# Patient Record
Sex: Female | Born: 1990 | Race: Asian | Hispanic: No | Marital: Married | Smoking: Never smoker
Health system: Southern US, Community
[De-identification: ages and names within clinical notes are randomized; demographics above are authoritative.]

## PROBLEM LIST (undated history)

## (undated) ENCOUNTER — Inpatient Hospital Stay (HOSPITAL_COMMUNITY): Payer: Self-pay

## (undated) DIAGNOSIS — J45909 Unspecified asthma, uncomplicated: Secondary | ICD-10-CM

## (undated) DIAGNOSIS — D219 Benign neoplasm of connective and other soft tissue, unspecified: Secondary | ICD-10-CM

## (undated) DIAGNOSIS — Z789 Other specified health status: Secondary | ICD-10-CM

## (undated) HISTORY — PX: NO PAST SURGERIES: SHX2092

## (undated) HISTORY — DX: Benign neoplasm of connective and other soft tissue, unspecified: D21.9

## (undated) HISTORY — DX: Unspecified asthma, uncomplicated: J45.909

---
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
## 2012-05-12 IMAGING — US US ART/VEN ABD/PELV/SCROTUM DOPPLER LTD
1 series · 14 of 25 positions shown · non-contrast
Comparison: None

CLINICAL DATA: Pelvic pain, left greater than right.

TRANSABDOMINAL ULTRASOUND OF PELVIS
DOPPLER ULTRASOUND OF OVARIES
TECHNIQUE: Transabdominal ultrasound examination of the pelvis was
performed including evaluation of the uterus, ovaries, adnexal
regions, and pelvic cul-de-sac. Color and duplex Doppler ultrasound
was utilized to evaluate blood flow to the ovaries.

[Series 1: us art/ven abd/pelv/scrotum doppler ltd · 0.28mm/px · 14 of 36 slices shown]
[im 1/36]
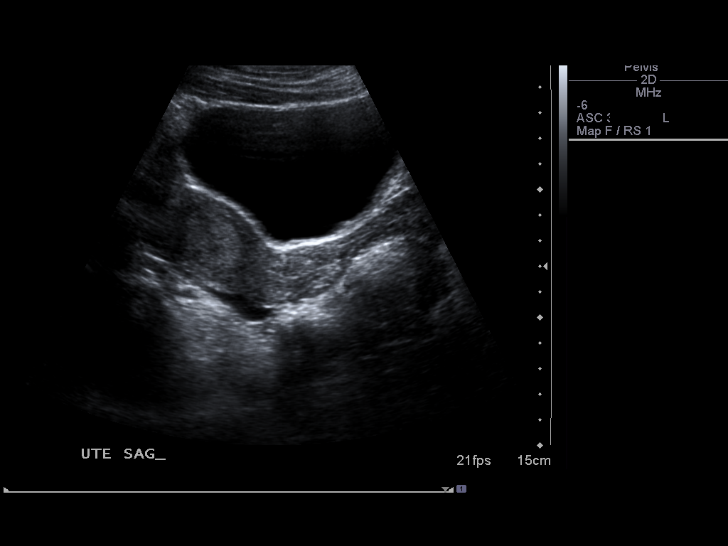
[im 3/36]
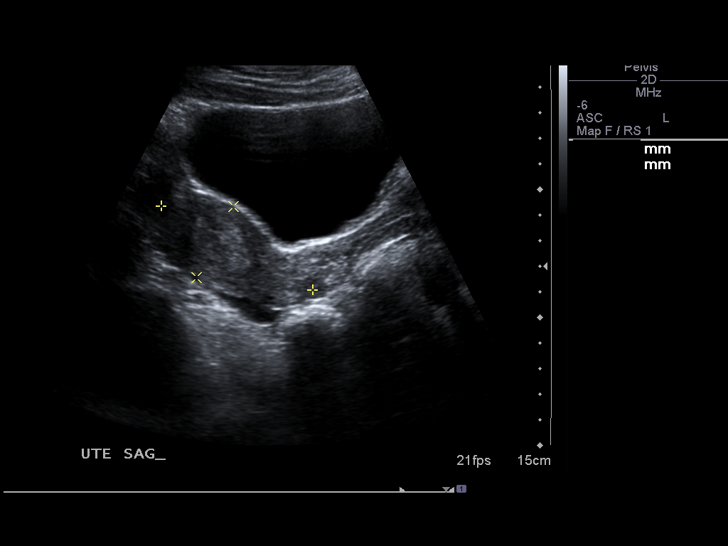
[im 6/36]
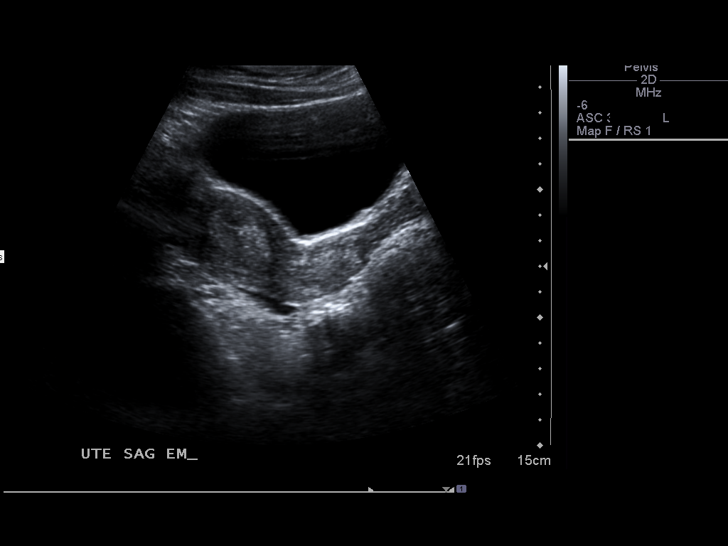
[im 9/36]
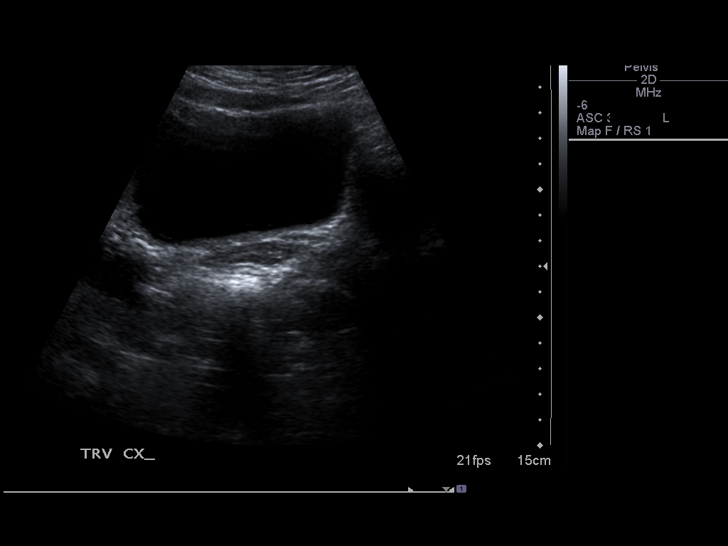
[im 12/36]
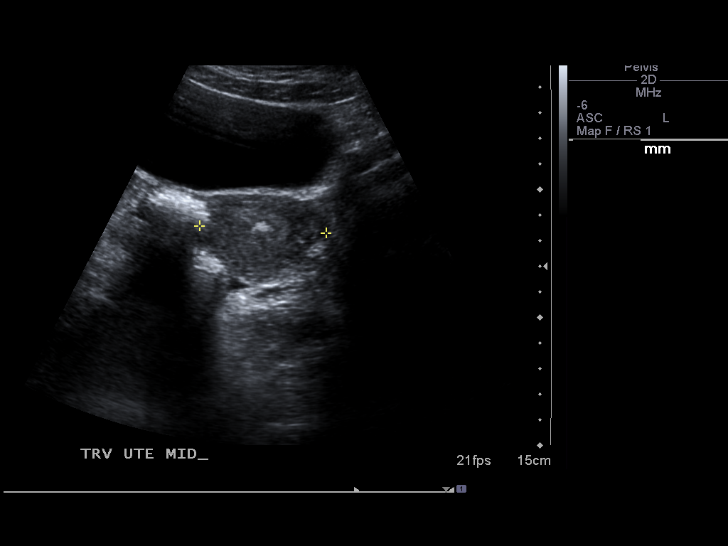
[im 14/36]
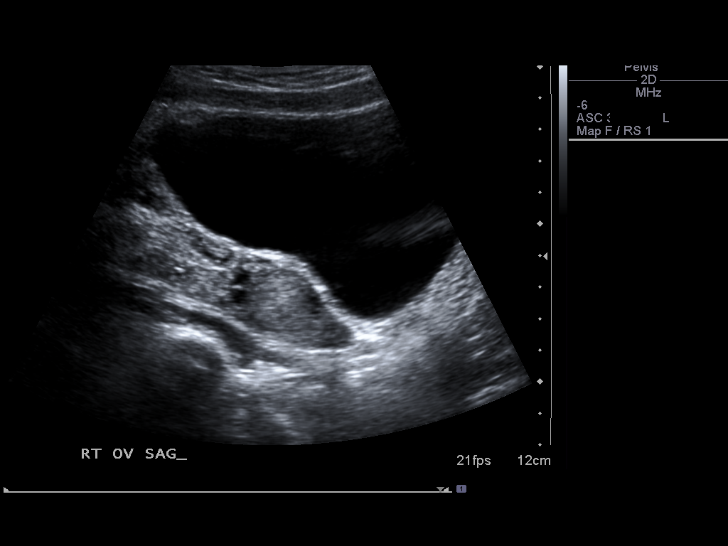
[im 17/36]
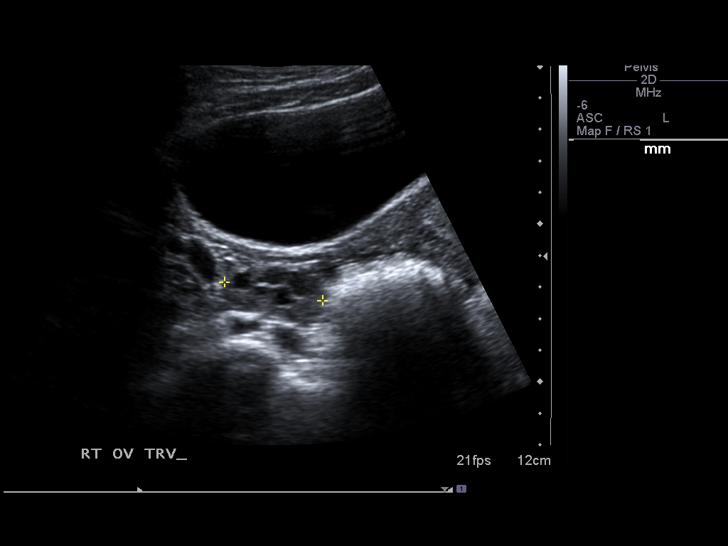
[im 19/36]
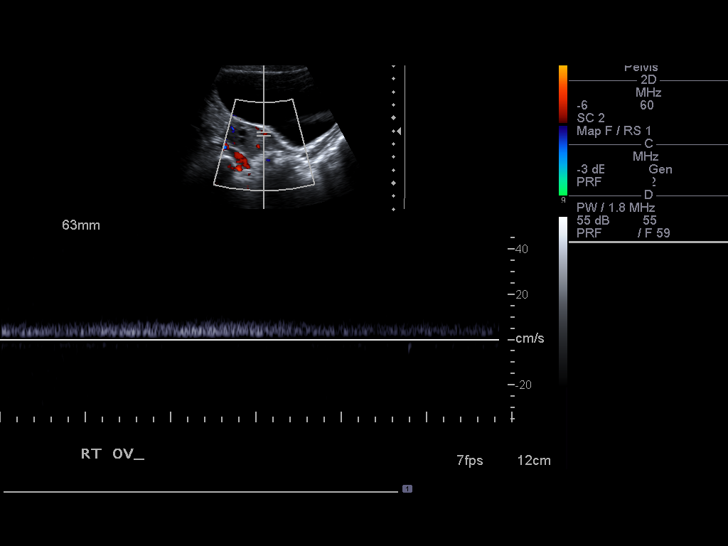
[im 22/36]
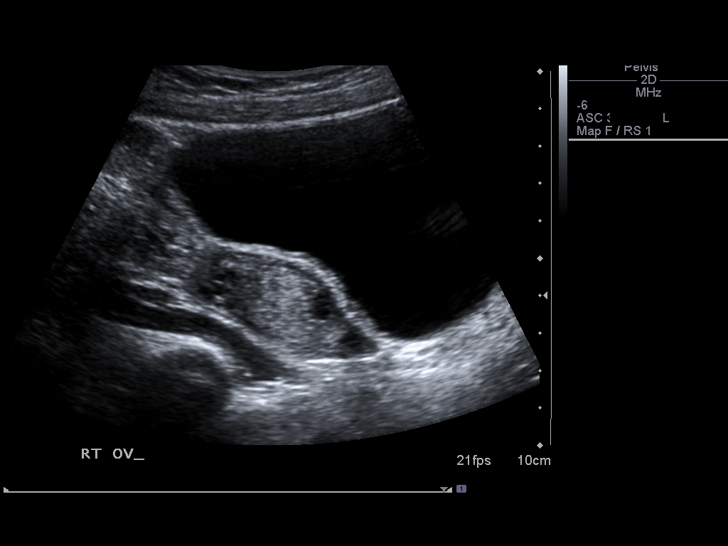
[im 24/36]
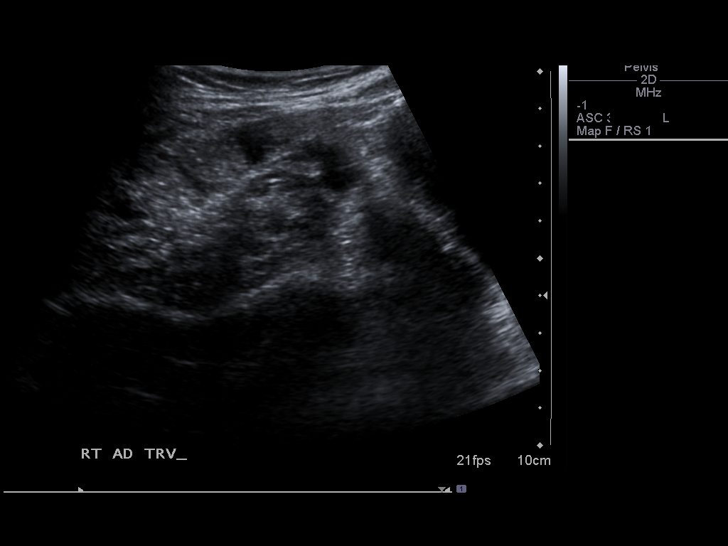
[im 27/36]
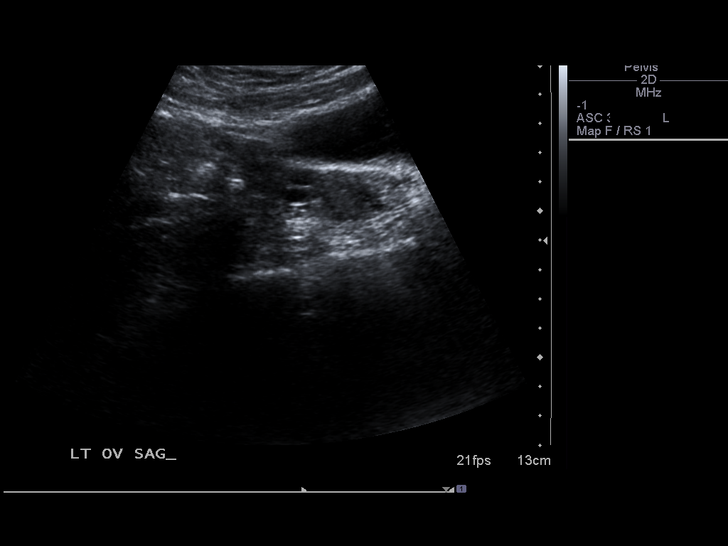
[im 30/36]
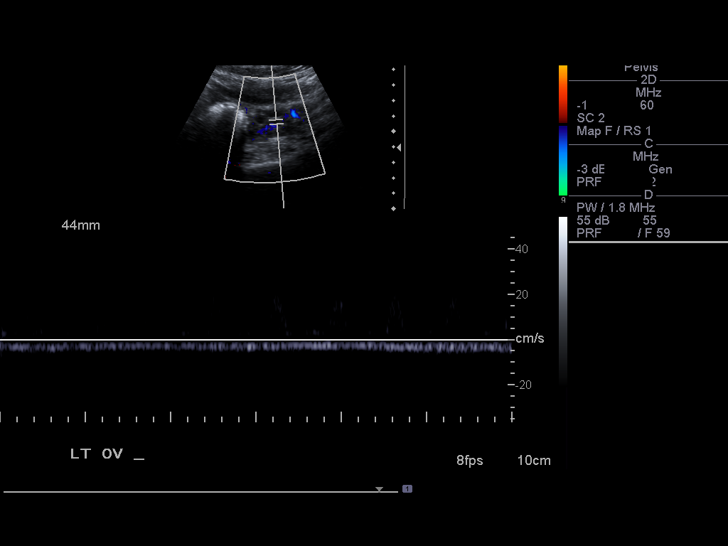
[im 33/36]
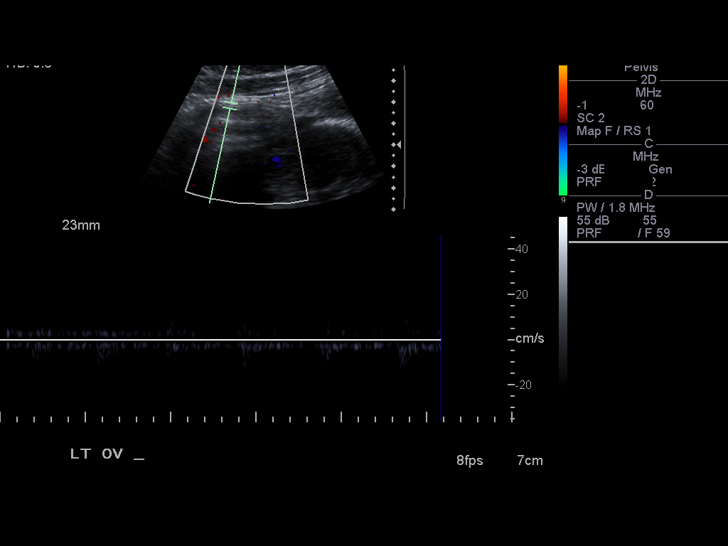
[im 36/36]
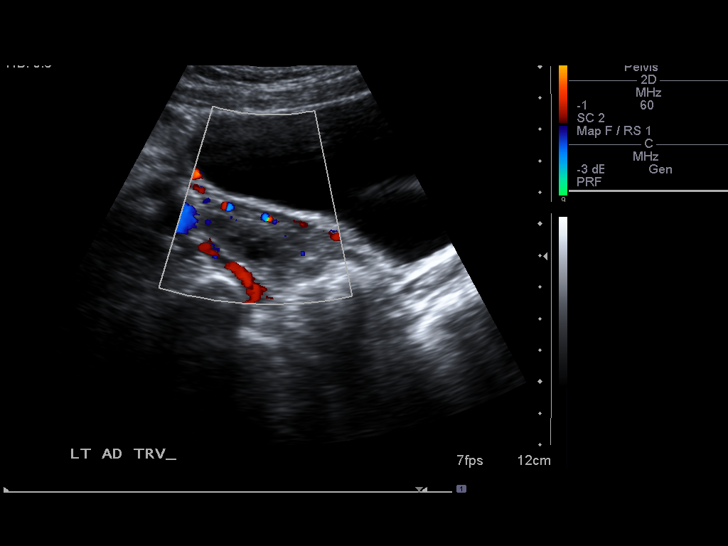

[14 of 25 positions shown; findings below may reference images not displayed]

FINDINGS: Uterus 6.9 x 3.1 x 5.0 cm.  Normal echotexture.  No focal
abnormality.

Endometrium normal, 2 mm.

Right Ovary 4.4 x 2.3 x 3.2 cm. Normal size and echotexture.  No
adnexal masses.  Normal arterial and venous blood flow.

Left Ovary 4.5 x 1.9 x 3.8 cm. Normal size and echotexture.  No
adnexal masses.  Normal arterial and venous blood flow.

Other Findings:  No free fluid.
IMPRESSION: Unremarkable study.

## 2015-04-24 ENCOUNTER — Ambulatory Visit (INDEPENDENT_AMBULATORY_CARE_PROVIDER_SITE_OTHER): Payer: BLUE CROSS/BLUE SHIELD | Admitting: Family Medicine

## 2015-04-24 VITALS — BP 122/72 | HR 86 | Temp 98.3°F | Resp 17 | Ht 61.5 in | Wt 135.0 lb

## 2015-04-24 DIAGNOSIS — R062 Wheezing: Secondary | ICD-10-CM

## 2015-04-24 DIAGNOSIS — J4521 Mild intermittent asthma with (acute) exacerbation: Secondary | ICD-10-CM | POA: Diagnosis not present

## 2015-04-24 DIAGNOSIS — R059 Cough, unspecified: Secondary | ICD-10-CM

## 2015-04-24 DIAGNOSIS — R05 Cough: Secondary | ICD-10-CM

## 2015-04-24 MED ORDER — ALBUTEROL SULFATE HFA 108 (90 BASE) MCG/ACT IN AERS: 2.0000 | INHALATION_SPRAY | RESPIRATORY_TRACT | Status: DC | PRN

## 2015-04-24 MED ORDER — PREDNISONE 20 MG PO TABS: ORAL_TABLET | ORAL | Status: DC

## 2015-04-24 MED ORDER — ALBUTEROL SULFATE (2.5 MG/3ML) 0.083% IN NEBU
2.5000 mg | INHALATION_SOLUTION | Freq: Once | RESPIRATORY_TRACT | Status: AC
  Administered 2015-04-24: 2.5 mg via RESPIRATORY_TRACT

## 2015-04-24 MED ORDER — IPRATROPIUM BROMIDE 0.02 % IN SOLN
0.5000 mg | Freq: Once | RESPIRATORY_TRACT | Status: AC
  Administered 2015-04-24: 0.5 mg via RESPIRATORY_TRACT

## 2015-04-24 NOTE — Progress Notes (Signed)
Subjective:    Patient ID: Lindsey Harvey, female    DOB: 12-05-1990, 25 y.o.   MRN: [id number]  HPI This is a pleasant 25 yo female who presents today with 2 week history of dry cough. She has a history of asthma. She does not take any medicine. Runny nose resolved, no sore throat or ear pain. Hears wheezes at night and in the morning. Used an inhaler last year for her asthma and stopped it when she felt better. Rarely has trouble with breathing, usually 1-2 x per year with allergy or cold symptoms.  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 Wears a mask.   Past Medical History  Diagnosis Date  . Asthma    History reviewed. No pertinent past surgical history. History reviewed. No pertinent family history. Social History  Substance Use Topics  . Smoking status: Never Smoker   . Smokeless tobacco: None  . Alcohol Use: No    Review of Systems  Constitutional: Positive for fatigue. Negative for fever and chills.  HENT: Negative for congestion, ear pain, postnasal drip, rhinorrhea and sore throat.   Respiratory: Positive for cough, chest tightness and wheezing. Negative for shortness of breath.   Neurological: Negative for headaches.       Objective:   Physical Exam  Constitutional: She is oriented to person, place, and time. She appears well-developed and well-nourished. No distress.  HENT:  Head: Normocephalic and atraumatic.  Right Ear: Tympanic membrane, external ear and ear canal normal.  Left Ear: Tympanic membrane, external ear and ear canal normal.  Nose: Nose normal.  Mouth/Throat: Oropharynx is clear and moist. No oropharyngeal exudate.  Eyes: Conjunctivae are normal.  Neck: Normal range of motion. Neck supple.  Cardiovascular: Normal rate, regular rhythm and normal heart sounds.   Pulmonary/Chest: Effort normal. She has wheezes (few fine expiratory wheezes, cleared after nebulizer).  Musculoskeletal: Normal range of motion.  Lymphadenopathy:    She has no  cervical adenopathy.  Neurological: She is alert and oriented to person, place, and time.  Skin: Skin is warm and dry. She is not diaphoretic.  Psychiatric: She has a normal mood and affect. Her behavior is normal. Judgment and thought content normal.  Vitals reviewed.    BP 122/72 mmHg  Pulse 86  Temp(Src) 98.3 F (36.8 C) (Oral)  Resp 17  Ht 5' 1.5" (1.562 m)  Wt 135 lb (61.236 kg)  BMI 25.10 kg/m2  SpO2 97%  LMP 03/21/2015 Albuterol/atrovent nebulizer treatment given with subjective and objective improvement. PF- 175, after treatment 225. Expected- 472. Effort poor    Assessment & Plan:  1. Cough - albuterol (PROVENTIL) (2.5 MG/3ML) 0.083% nebulizer solution 2.5 mg; Take 3 mLs (2.5 mg total) by nebulization once. - ipratropium (ATROVENT) nebulizer solution 0.5 mg; Take 2.5 mLs (0.5 mg total) by nebulization once. - predniSONE (DELTASONE) 20 MG tablet; Take 3x3 days, 2x3 days, 1x3 days  Dispense: 18 tablet; Refill: 0 - albuterol (PROVENTIL HFA;VENTOLIN HFA) 108 (90 Base) MCG/ACT inhaler; Inhale 2 puffs into the lungs every 4 (four) hours as needed for wheezing or shortness of breath (cough, shortness of breath or wheezing.).  Dispense: 1 Inhaler; Refill: 1  2. Wheeze - albuterol (PROVENTIL) (2.5 MG/3ML) 0.083% nebulizer solution 2.5 mg; Take 3 mLs (2.5 mg total) by nebulization once. - ipratropium (ATROVENT) nebulizer solution 0.5 mg; Take 2.5 mLs (0.5 mg total) by nebulization once. - predniSONE (DELTASONE) 20 MG tablet; Take 3x3 days, 2x3 days, 1x3 days  Dispense: 18 tablet;  Refill: 0 - albuterol (PROVENTIL HFA;VENTOLIN HFA) 108 (90 Base) MCG/ACT inhaler; Inhale 2 puffs into the lungs every 4 (four) hours as needed for wheezing or shortness of breath (cough, shortness of breath or wheezing.).  Dispense: 1 Inhaler; Refill: 1  3. Asthma with acute exacerbation, mild intermittent - albuterol (PROVENTIL) (2.5 MG/3ML) 0.083% nebulizer solution 2.5 mg; Take 3 mLs (2.5 mg total) by  nebulization once. - ipratropium (ATROVENT) nebulizer solution 0.5 mg; Take 2.5 mLs (0.5 mg total) by nebulization once. - predniSONE (DELTASONE) 20 MG tablet; Take 3x3 days, 2x3 days, 1x3 days  Dispense: 18 tablet; Refill: 0 - albuterol (PROVENTIL HFA;VENTOLIN HFA) 108 (90 Base) MCG/ACT inhaler; Inhale 2 puffs into the lungs every 4 (four) hours as needed for wheezing or shortness of breath (cough, shortness of breath or wheezing.).  Dispense: 1 Inhaler; Refill: 1  - discussed asthma and need recheck if she has continued problems, may need inhaled corticosteroid - RTC precautions reviewed  Olean Reeeborah Eli Pattillo, FNP-BC  [facility]  04/24/2015 9:46 PM

## 2015-04-24 NOTE — Patient Instructions (Addendum)
If you are not feeling better in 3-5 days, please come back in to see us     IF you received an x-ray today, you will receive an [facility] from [facility] Please contact [facility] at [phone] with questions or concerns regarding your invoice.   IF you received labwork today, you will receive an invoice from [facility]. Please contact [facility] at [phone] with questions or concerns regarding your invoice.   Our billing staff will not be able to assist you with questions regarding bills from these companies.  You will be contacted with the lab results as soon as they are available. The fastest way to get your results is to activate your My Chart account. Instructions are located on the last page of this paperwork. If you have not heard from us regarding the results in 2 weeks, please contact this office.

## 2016-11-05 ENCOUNTER — Encounter: Payer: Self-pay | Admitting: Family Medicine

## 2016-11-05 ENCOUNTER — Ambulatory Visit (INDEPENDENT_AMBULATORY_CARE_PROVIDER_SITE_OTHER): Payer: Self-pay | Admitting: Family Medicine

## 2016-11-05 VITALS — BP 102/70 | HR 80 | Temp 98.0°F | Resp 16 | Ht 61.42 in | Wt 136.0 lb

## 2016-11-05 DIAGNOSIS — N912 Amenorrhea, unspecified: Secondary | ICD-10-CM

## 2016-11-05 DIAGNOSIS — Z3A01 Less than 8 weeks gestation of pregnancy: Secondary | ICD-10-CM

## 2016-11-05 LAB — POCT URINALYSIS DIP (MANUAL ENTRY)
BILIRUBIN UA: NEGATIVE
BILIRUBIN UA: NEGATIVE mg/dL
Blood, UA: NEGATIVE
GLUCOSE UA: NEGATIVE mg/dL
Leukocytes, UA: NEGATIVE
Nitrite, UA: NEGATIVE
Urobilinogen, UA: 0.2 E.U./dL
pH, UA: 5.5 (ref 5.0–8.0)

## 2016-11-05 LAB — POCT URINE PREGNANCY: Preg Test, Ur: POSITIVE — AB

## 2016-11-05 MED ORDER — PRENATAL MULTIVITAMIN PLUS DHA 27-0.8-250 MG PO CAPS: 1.0000 | ORAL_CAPSULE | Freq: Every day | ORAL | 3 refills | Status: DC

## 2016-11-05 MED ORDER — POLYETHYLENE GLYCOL 3350 17 GM/SCOOP PO POWD: 17.0000 g | Freq: Every day | ORAL | 1 refills | Status: DC | PRN

## 2016-11-05 NOTE — Progress Notes (Signed)
Subjective:  By signing my name below, I, Lindsey Harvey, attest that this documentation has been prepared under the direction and in the presence of Lindsey Sorenson, MD. Electronically Signed: Stann Harvey, Scribe. 11/05/2016 , 11:46 AM .  Patient was seen in Room 1 .   Patient ID: Lindsey Harvey, female    DOB: 1990/12/17, 26 y.o.   MRN: [id number] Chief Complaint  Patient presents with  . Possible Pregnancy    pt states she has missed her cycle by 10 days    HPI Lindsey Harvey is a 26 y.o. female who presents to Primary Care at [facility] for possible pregnancy. She states her period is about 10 days late. She believes her last period was Sept 2nd, usually lasts about 3-4 days. She denies bleeding or spotting since her last period. She denies any nausea or vomiting. She's been trying to become pregnant.   She has had some cramping in her lower abdomen that started a few days ago; sometimes during the day, and sometimes at night time. She hasn't taken any medications for her cramping. She denies taking any vitamins or supplements. This is her first pregnancy. She mentions some white vaginal discharge, but this is normal for her. She usually has some breast tenderness with her periods. She denies taking birth control. She isn't followed by OBGYN yet; would like some numbers and contacts.   She notes baseline constipation, unchanged. She's been trying to drink prune juice. She states her urination is unchanged, but doesn't drink enough water. She denies history of alcohol or smoking; denies anyone smoking at home.   She will defer flu shot. She mentions becoming really sick after receiving flu shot 5 years ago.   Past Medical History:  Diagnosis Date  . Asthma    Prior to Admission medications   Medication Sig Start Date End Date Taking? Authorizing Provider  albuterol (PROVENTIL HFA;VENTOLIN HFA) 108 (90 Base) MCG/ACT inhaler Inhale 2 puffs into the lungs every 4 (four) hours as needed for wheezing or  shortness of breath (cough, shortness of breath or wheezing.). 04/24/15   Emi Belfast, FNP  predniSONE (DELTASONE) 20 MG tablet Take 3x3 days, 2x3 days, 1x3 days 04/24/15   Emi Belfast, FNP   No Known Allergies  Review of Systems  Constitutional: Negative for chills, fatigue, fever and unexpected weight change.  Respiratory: Negative for cough.   Gastrointestinal: Positive for abdominal pain (discomfort) and constipation. Negative for diarrhea, nausea and vomiting.  Genitourinary: Positive for menstrual problem. Negative for vaginal bleeding.  Skin: Negative for rash and wound.  Neurological: Negative for dizziness, weakness and headaches.       Objective:   Physical Exam  Constitutional: She is oriented to person, place, and time. She appears well-developed and well-nourished. No distress.  HENT:  Head: Normocephalic and atraumatic.  Eyes: Pupils are equal, round, and reactive to light. EOM are normal.  Neck: Normal range of motion. Neck supple. No thyromegaly present.  Cardiovascular: Normal rate, regular rhythm, normal heart sounds and intact distal pulses.   Pulmonary/Chest: Effort normal and breath sounds normal. No respiratory distress.  Abdominal: Soft. Bowel sounds are normal. She exhibits no distension and no mass. There is no tenderness. There is no rebound, no guarding and no CVA tenderness.  Musculoskeletal: Normal range of motion. She exhibits no edema.  Lymphadenopathy:    She has no cervical adenopathy.  Neurological: She is alert and oriented to person, place, and time.  Skin: Skin is warm and dry.  She is not diaphoretic. No erythema.  Psychiatric: She has a normal mood and affect. Her behavior is normal.  Nursing note and vitals reviewed.   BP 102/70   Pulse 80   Temp 98 F (36.7 C) (Oral)   Resp 16   Ht 5' 1.42" (1.56 m)   Wt 136 lb (61.7 kg)   LMP 09/26/2016 (Approximate)   SpO2 100%   BMI 25.35 kg/m   Results for orders placed or performed  in visit on 11/05/16  POCT urine pregnancy  Result Value Ref Range   Preg Test, Ur Positive (A) Negative  POCT urinalysis dipstick  Result Value Ref Range   Color, UA yellow yellow   Clarity, UA clear clear   Glucose, UA negative negative mg/dL   Bilirubin, UA negative negative   Ketones, POC UA negative negative mg/dL   Spec Grav, UA >=1.610 (A) 1.010 - 1.025   Blood, UA negative negative   pH, UA 5.5 5.0 - 8.0   Protein Ur, POC trace (A) negative mg/dL   Urobilinogen, UA 0.2 0.2 or 1.0 E.U./dL   Nitrite, UA Negative Negative   Leukocytes, UA Negative Negative       Assessment & Plan:   1. Amenorrhea   2. Less than [redacted] weeks gestation of pregnancy    Confirmation letter given for preg medicaid. Start pnv. Establish w/ ob in ~ 1 mo. No etoh, second hand smoke, careful of foods. Rec flu shot - pt declined but will consider (didn't press as will be much more affordable after health ins is obtained).  Advised to please RTC for nurse only visit for flu shot whenever needed.  Treat constipation with prune juice and prn  Miralax.  RTC if any vag spotting/bleeding.  Orders Placed This Encounter  Procedures  . POCT urine pregnancy  . POCT urinalysis dipstick    Meds ordered this encounter  Medications  . Prenatal MV-Min-Fe Fum-FA-DHA (PRENATAL MULTIVITAMIN PLUS DHA) 27-0.8-250 MG CAPS    Sig: Take 1 capsule by mouth daily.    Dispense:  90 capsule    Refill:  3  . polyethylene glycol powder (GLYCOLAX/MIRALAX) powder    Sig: Take 17 g by mouth daily as needed for mild constipation.    Dispense:  500 g    Refill:  1    I personally performed the services described in this documentation, which was scribed in my presence. The recorded information has been reviewed and considered, and addended by me as needed.   Lindsey Harvey, M.D.  Primary Care at [facility] [address] [place], [place] [address] [phone] phone [phone] fax  11/05/16 1:24 PM

## 2016-11-05 NOTE — Patient Instructions (Addendum)
CONGRATUATIONS!!!!  Based upon your last menstrual period starting on September 26, 2016, you are [redacted] weeks pregnant and your due date is July 03, 2017  Check out these OB options: [facility] outpatient clinic [facility]     First Trimester of Pregnancy The first trimester of pregnancy is from week 1 until the end of week 13 (months 1 through 3). During this time, your baby will begin to develop inside you. At 6-8 weeks, the eyes and face are formed, and the heartbeat can be seen on ultrasound. At the end of 12 weeks, all the baby's organs are formed. Prenatal care is all the medical care you receive before the birth of your baby. Make sure you get good prenatal care and follow all of your doctor's instructions. Follow these instructions at home: Medicines  Take over-the-counter and prescription medicines only as told by your doctor. Some medicines are safe and some medicines are not safe during pregnancy.  Take a prenatal vitamin that contains at least 600 micrograms (mcg) of folic acid.  If you have trouble pooping (constipation), take medicine that will make your stool soft (stool softener) if your doctor approves. Eating and drinking  Eat regular, healthy meals.  Your doctor will tell you the amount of weight gain that is right for you.  Avoid raw meat and uncooked cheese.  If you feel sick to your stomach (nauseous) or throw up (vomit): ? Eat 4 or 5 small meals a day instead of 3 large meals. ? Try eating a few soda crackers. ? Drink liquids between meals instead of during meals.  To prevent constipation: ? Eat foods that are high in fiber, like fresh fruits and vegetables, whole grains, and beans. ? Drink enough fluids to keep your pee (urine) clear or pale yellow. Activity  Exercise only as told by your doctor. Stop exercising if you have cramps or pain in your lower belly (abdomen) or low back.  Do not exercise  if it is too hot, too humid, or if you are in a place of great height (high altitude).  Try to avoid standing for long periods of time. Move your legs often if you must stand in one place for a long time.  Avoid heavy lifting.  Wear low-heeled shoes. Sit and stand up straight.  You can have sex unless your doctor tells you not to. Relieving pain and discomfort  Wear a good support bra if your breasts are sore.  Take warm water baths (sitz baths) to soothe pain or discomfort caused by hemorrhoids. Use hemorrhoid cream if your doctor says it is okay.  Rest with your legs raised if you have leg cramps or low back pain.  If you have puffy, bulging veins (varicose veins) in your legs: ? Wear support hose or compression stockings as told by your doctor. ? Raise (elevate) your feet for 15 minutes, 3-4 times a day. ? Limit salt in your food. Prenatal care  Schedule your prenatal visits by the twelfth week of pregnancy.  Write down your questions. Take them to your prenatal visits.  Keep all your prenatal visits as told by your doctor. This is important. Safety  Wear your seat belt at all times when driving.  Make a list of emergency phone numbers. The list should include numbers for family, friends, the hospital, and police and fire departments. General instructions  Ask your doctor for a referral to a local prenatal class. Begin classes no  later than at the start of month 6 of your pregnancy.  Ask for help if you need counseling or if you need help with nutrition. Your doctor can give you advice or tell you where to go for help.  Do not use hot tubs, steam rooms, or saunas.  Do not douche or use tampons or scented sanitary pads.  Do not cross your legs for long periods of time.  Avoid all herbs and alcohol. Avoid drugs that are not approved by your doctor.  Do not use any tobacco products, including cigarettes, chewing tobacco, and electronic cigarettes. If you need help  quitting, ask your doctor. You may get counseling or other support to help you quit.  Avoid cat litter boxes and soil used by cats. These carry germs that can cause birth defects in the baby and can cause a loss of your baby (miscarriage) or stillbirth.  Visit your dentist. At home, brush your teeth with a soft toothbrush. Be gentle when you floss. Contact a doctor if:  You are dizzy.  You have mild cramps or pressure in your lower belly.  You have a nagging pain in your belly area.  You continue to feel sick to your stomach, you throw up, or you have watery poop (diarrhea).  You have a bad smelling fluid coming from your vagina.  You have pain when you pee (urinate).  You have increased puffiness (swelling) in your face, hands, legs, or ankles. Get help right away if:  You have a fever.  You are leaking fluid from your vagina.  You have spotting or bleeding from your vagina.  You have very bad belly cramping or pain.  You gain or lose weight rapidly.  You throw up blood. It may look like coffee grounds.  You are around people who have [place] measles, fifth disease, or chickenpox.  You have a very bad headache.  You have shortness of breath.  You have any kind of trauma, such as from a fall or a car accident. Summary  The first trimester of pregnancy is from week 1 until the end of week 13 (months 1 through 3).  To take care of yourself and your unborn baby, you will need to eat healthy meals, take medicines only if your doctor tells you to do so, and do activities that are safe for you and your baby.  Keep all follow-up visits as told by your doctor. This is important as your doctor will have to ensure that your baby is healthy and growing well. This information is not intended to replace advice given to you by your health care provider. Make sure you discuss any questions you have with your health care provider. Document Released: 06/30/2007 Document Revised:  01/20/2016 Document Reviewed: 01/20/2016 Elsevier Interactive Patient Education  2017 Elsevier Inc.    Abdominal Pain During Pregnancy Belly (abdominal) pain is common during pregnancy. Most of the time, it is not a serious problem. Other times, it can be a sign that something is wrong with the pregnancy. Always tell your doctor if you have belly pain. Follow these instructions at home: Monitor your belly pain for any changes. The following actions may help you feel better:  Do not have sex (intercourse) or put anything in your vagina until you feel better.  Rest until your pain stops.  Drink clear fluids if you feel sick to your stomach (nauseous). Do not eat solid food until you feel better.  Only take medicine as told by your doctor.  Keep all doctor visits as told.  Get help right away if:  You are bleeding, leaking fluid, or pieces of tissue come out of your vagina.  You have more pain or cramping.  You keep throwing up (vomiting).  You have pain when you pee (urinate) or have blood in your pee.  You have a fever.  You do not feel your baby moving as much.  You feel very weak or feel like passing out.  You have trouble breathing, with or without belly pain.  You have a very bad headache and belly pain.  You have fluid leaking from your vagina and belly pain.  You keep having watery poop (diarrhea).  Your belly pain does not go away after resting, or the pain gets worse. This information is not intended to replace advice given to you by your health care provider. Make sure you discuss any questions you have with your health care provider. Document Released: 12/30/2008 Document Revised: 08/20/2015 Document Reviewed: 08/10/2012 Elsevier Interactive Patient Education  2018 [employer].  Commonly Asked Questions During Pregnancy  Cats: A parasite can be excreted in cat feces.  To avoid exposure you need to have another person empty the little box.  If you must  empty the litter box you will need to wear gloves.  Wash your hands after handling your cat.  This parasite can also be found in raw or undercooked meat so this should also be avoided.  Colds, Sore Throats, Flu: Please check your medication sheet to see what you can take for symptoms.  If your symptoms are unrelieved by these medications please call the office.  Dental Work: Most any dental work Agricultural consultant recommends is permitted.  X-rays should only be taken during the first trimester if absolutely necessary.  Your abdomen should be shielded with a lead apron during all x-rays.  Please notify your provider prior to receiving any x-rays.  Novocaine is fine; gas is not recommended.  If your dentist requires a note from [place] prior to dental work please call the office and we will provide one for you.  Exercise: Exercise is an important part of staying healthy during your pregnancy.  You may continue most exercises you were accustomed to prior to pregnancy.  Later in your pregnancy you will most likely notice you have difficulty with activities requiring balance like riding a bicycle.  It is important that you listen to your body and avoid activities that put you at a higher risk of falling.  Adequate rest and staying well hydrated are a must!  If you have questions about the safety of specific activities ask your provider.    Exposure to Children with illness: Try to avoid obvious exposure; report any symptoms to [place] when noted,  If you have chicken pos, red measles or mumps, you should be immune to these diseases.   Please do not take any vaccines while pregnant unless you have checked with your OB provider.  Fetal Movement: After 28 weeks we recommend you do "kick counts" twice daily.  Lie or sit down in a calm quiet environment and count your baby movements "kicks".  You should feel your baby at least 10 times per hour.  If you have not felt 10 kicks within the first hour get up, walk around and have  something sweet to eat or drink then repeat for an additional hour.  If count remains less than 10 per hour notify your provider.  Fumigating: Follow your pest control agent's  advice as to how long to stay out of your home.  Ventilate the area well before re-entering.  Hemorrhoids:   Most over-the-counter preparations can be used during pregnancy.  Check your medication to see what is safe to use.  It is important to use a stool softener or fiber in your diet and to drink lots of liquids.  If hemorrhoids seem to be getting worse please call the office.   Hot Tubs:  Hot tubs Jacuzzis and saunas are not recommended while pregnant.  These increase your internal body temperature and should be avoided.  Intercourse:  Sexual intercourse is safe during pregnancy as long as you are comfortable, unless otherwise advised by your provider.  Spotting may occur after intercourse; report any bright red bleeding that is heavier than spotting.  Labor:  If you know that you are in labor, please go to the hospital.  If you are unsure, please call the office and let us help you decide what to do.  Lifting, straining, etc:  If your job requires heavy lifting or straining please check with your provider for any limitations.  Generally, you should not lift items heavier than that you can lift simply with your hands and arms (no back muscles)  Painting:  Paint fumes do not harm your pregnancy, but may make you ill and should be avoided if possible.  Latex or water based paints have less odor than oils.  Use adequate ventilation while painting.  Permanents & Hair Color:  Chemicals in hair dyes are not recommended as they cause increase hair dryness which can increase hair loss during pregnancy.  " Highlighting" and permanents are allowed.  Dye may be absorbed differently and permanents may not hold as well during pregnancy.  Sunbathing:  Use a sunscreen, as skin burns easily during pregnancy.  Drink plenty of fluids;  avoid over heating.  Tanning Beds:  Because their possible side effects are still unknown, tanning beds are not recommended.  Ultrasound Scans:  Routine ultrasounds are performed at approximately 20 weeks.  You will be able to see your baby's general anatomy an if you would like to know the gender this can usually be determined as well.  If it is questionable when you conceived you may also receive an ultrasound early in your pregnancy for dating purposes.  Otherwise ultrasound exams are not routinely performed unless there is a medical necessity.  Although you can request a scan we ask that you pay for it when conducted because insurance does not cover " patient request" scans.  Work: If your pregnancy proceeds without complications you may work until your due date, unless your physician or employer advises otherwise.  Round Ligament Pain/Pelvic Discomfort:  Sharp, shooting pains not associated with bleeding are fairly common, usually occurring in the second trimester of pregnancy.  They tend to be worse when standing up or when you remain standing for long periods of time.  These are the result of pressure of certain pelvic ligaments called "round ligaments".  Rest, Tylenol and heat seem to be the most effective relief.  As the womb and fetus grow, they rise out of the pelvis and the discomfort improves.  Please notify the office if your pain seems different than that described.  It may represent a more serious condition.  Common Medications Safe in Pregnancy  Acne:      Constipation:  Benzoyl Peroxide     Colace  Clindamycin      Dulcolax Suppository  Topica Erythromycin  Fibercon  Salicylic Acid      Metamucil         Miralax AVOID:        Senakot   Accutane    Cough:  Retin-A       Cough Drops  Tetracycline      Phenergan w/ Codeine if Rx  Minocycline      Robitussin (Plain &  DM)  Antibiotics:     Crabs/Lice:  Ceclor       RID  Cephalosporins    AVOID:  E-Mycins      Kwell  Keflex  Macrobid/Macrodantin   Diarrhea:  Penicillin      Kao-Pectate  Zithromax      Imodium AD         PUSH FLUIDS AVOID:       Cipro     Fever:  Tetracycline      Tylenol (Regular or Extra  Minocycline       Strength)  Levaquin      Extra Strength-Do not          Exceed 8 tabs/24 hrs Caffeine:        <275m/day (equiv. To 1 cup of coffee or  approx. 3 12 oz sodas)         Gas: Cold/Hayfever:       Gas-X  Benadryl      Mylicon  Claritin       Phazyme  **Claritin-D        Chlor-Trimeton    Headaches:  Dimetapp      ASA-Free Excedrin  Drixoral-Non-Drowsy     Cold Compress  Mucinex (Guaifenasin)     Tylenol (Regular or Extra  Sudafed/Sudafed-12 Hour     Strength)  **Sudafed PE Pseudoephedrine   Tylenol Cold & Sinus     Vicks Vapor Rub  Zyrtec  **AVOID if Problems With Blood Pressure         Heartburn: Avoid lying down for at least 1 hour after meals  Aciphex      Maalox     Rash:  Milk of Magnesia     Benadryl    Mylanta       1% Hydrocortisone Cream  Pepcid  Pepcid Complete   Sleep Aids:  Prevacid      Ambien   Prilosec       Benadryl  Rolaids       Chamomile Tea  Tums (Limit 4/day)     Unisom  Zantac       Tylenol PM         Warm milk-add vanilla or  Hemorrhoids:       Sugar for taste  Anusol/Anusol H.C.  (RX: Analapram 2.5%)  Sugar Substitutes:  Hydrocortisone OTC     Ok in moderation  Preparation H      Tucks        Vaseline lotion applied to tissue with wiping    Herpes:     Throat:  Acyclovir      Oragel  Famvir  Valtrex     Vaccines:         Flu Shot Leg Cramps:       *Gardasil  Benadryl      Hepatitis A         Hepatitis B Nasal Spray:       Pneumovax  Saline Nasal Spray     Polio Booster         Tetanus Nausea:       Tuberculosis test or PPD  Vitamin B6 25 mg TID   AVOID:    Dramamine      *Gardasil  Emetrol       Live  Poliovirus  Ginger Root 250 mg QID    MMR (measles, mumps &  High Complex Carbs @ Bedtime    rebella)  Sea Bands-Accupressure    Varicella (Chickenpox)  Unisom 1/2 tab TID     *No known complications           If received before Pain:         Known pregnancy;   Darvocet       Resume series after  Lortab        Delivery  Percocet    Yeast:   Tramadol      Femstat  Tylenol 3      Gyne-lotrimin  Ultram       Monistat  Vicodin           MISC:         All Sunscreens           Hair Coloring/highlights          Insect Repellant's          (Including DEET)         Mystic Tans

## 2017-01-25 NOTE — L&D Delivery Note (Addendum)
Delivery Note Curly RimJuen Lindsey Harvey is a 27 yo G1 now P1001 s/p SVD at 178w0d. She was induced for post-dates with FB, cytotec, oxytocin, and AROM. Around 10:45 AM she became complete and started pushing for about 1.5 hours.  At 12:18 PM a viable female was delivered via Vaginal, Spontaneous (Presentation: ROA).  APGAR: 8, 9; weight  Pending. After delivery attention was turned to active management of the third stage of labor with gentle traction via Brandt maneuver.   Placenta status: delivered spontaneously, intact, with trailing membranes.  Cord: 3-vessel  Anesthesia:  epidural Episiotomy: None Lacerations: Perineal 2nd degree Suture Repair: 3.0 monocryl Est. Blood Loss (mL):  388 mL  Mom to postpartum.  Baby to Couplet care / Skin to Skin.  Lindsey Harvey 07/11/2017, 1:29 PM  I confirm that I have verified the information documented in the resident's note and that I have also personally reperformed the physical exam and all medical decision making activities.  I was gloved and present for entire delivery SVD without incident No difficulty with shoulders Lacerations as listed above,  Persistent oozing required oversewing.  Considerable edema of perineum Repair of same supervised by me WIll keep ice on perineum  Aviva SignsMarie L Gracy Harvey, CNM

## 2017-02-03 LAB — OB RESULTS CONSOLE GC/CHLAMYDIA
CHLAMYDIA, DNA PROBE: NEGATIVE
GC PROBE AMP, GENITAL: NEGATIVE

## 2017-02-03 LAB — OB RESULTS CONSOLE HEPATITIS B SURFACE ANTIGEN: HEP B S AG: NEGATIVE

## 2017-02-03 LAB — OB RESULTS CONSOLE HIV ANTIBODY (ROUTINE TESTING): HIV: NONREACTIVE

## 2017-02-03 LAB — OB RESULTS CONSOLE ABO/RH: RH TYPE: POSITIVE

## 2017-02-03 LAB — OB RESULTS CONSOLE ANTIBODY SCREEN: Antibody Screen: NEGATIVE

## 2017-02-03 LAB — OB RESULTS CONSOLE RUBELLA ANTIBODY, IGM: Rubella: IMMUNE

## 2017-02-03 LAB — OB RESULTS CONSOLE RPR: RPR: NONREACTIVE

## 2017-06-02 LAB — OB RESULTS CONSOLE GBS: GBS: NEGATIVE

## 2017-06-02 LAB — OB RESULTS CONSOLE GC/CHLAMYDIA
Chlamydia: NEGATIVE
Gonorrhea: NEGATIVE

## 2017-06-17 ENCOUNTER — Encounter (HOSPITAL_COMMUNITY): Payer: Self-pay | Admitting: *Deleted

## 2017-06-17 ENCOUNTER — Inpatient Hospital Stay (HOSPITAL_COMMUNITY)
Admission: AD | Admit: 2017-06-17 | Discharge: 2017-06-17 | Disposition: A | Discharge status: Home / Self Care | Payer: Medicaid Other | Source: Ambulatory Visit | Attending: Obstetrics and Gynecology | Admitting: Obstetrics and Gynecology

## 2017-06-17 ENCOUNTER — Inpatient Hospital Stay (HOSPITAL_COMMUNITY): Payer: Medicaid Other

## 2017-06-17 DIAGNOSIS — Z3A38 38 weeks gestation of pregnancy: Secondary | ICD-10-CM | POA: Diagnosis not present

## 2017-06-17 DIAGNOSIS — Z3689 Encounter for other specified antenatal screening: Secondary | ICD-10-CM

## 2017-06-17 DIAGNOSIS — O4443 Low lying placenta NOS or without hemorrhage, third trimester: Secondary | ICD-10-CM

## 2017-06-17 DIAGNOSIS — O4440 Low lying placenta NOS or without hemorrhage, unspecified trimester: Secondary | ICD-10-CM

## 2017-06-17 HISTORY — DX: Other specified health status: Z78.9

## 2017-06-17 NOTE — MAU Provider Note (Addendum)
  History     CSN: [id number]  Arrival date and time: 06/17/17 1430  G1 .1 weeks sent from office for evaluation of low lying placenta. Last [place] at 34 wks showed low lying placenta 1.9 cm from internal os. Denies VB, LOF, or ctx. Good FM.   OB History    Gravida  1   Para      Term      Preterm      AB      Living        SAB      TAB      Ectopic      Multiple      Live Births              Past Medical History:  Diagnosis Date  . Medical history non-contributory     Past Surgical History:  Procedure Laterality Date  . NO PAST SURGERIES      No family history on file.  Social History   Tobacco Use  . Smoking status: Never Smoker  . Smokeless tobacco: Never Used  Substance Use Topics  . Alcohol use: Never    Frequency: Never  . Drug use: Never    Allergies: No Known Allergies  Medications Prior to Admission  Medication Sig Dispense Refill Last Dose  . Prenatal Vit-Fe Fumarate-FA (PRENATAL MULTIVITAMIN) TABS tablet Take 1 tablet by mouth daily at 12 noon.   06/16/2017 at Unknown time    Review of Systems  Gastrointestinal: Negative for abdominal pain.  Genitourinary: Negative for vaginal bleeding and vaginal discharge.   Physical Exam   BP 111/81   Pulse 85   Resp 18   Physical Exam  Constitutional: She is oriented to person, place, and time. She appears well-developed and well-nourished. No distress.  HENT:  Head: Normocephalic and atraumatic.  Neck: Normal range of motion.  Respiratory: Effort normal. No respiratory distress.  GI: Soft. She exhibits no distension. There is no tenderness.  Musculoskeletal: Normal range of motion.  Neurological: She is alert and oriented to person, place, and time.  Skin: Skin is warm and dry.  Psychiatric: She has a normal mood and affect.  EFM: 135 bpm, mod variability, + accels, no decels Toco: irregular, mild  MAU Course  Procedures  MDM [place] ordered. [place]: cephalic, placenta edge 11 cm from  internal os, AFI 15 cm. Discussed results with pt, reassured normal. Stable for discharge home.  Assessment and Plan   1. [redacted] weeks gestation of pregnancy   2. Low-lying placenta   3. NST (non-stress test) reactive    Discharge home Follow up at [facility] next week Labor precautions FMCs  Allergies as of 06/17/2017   No Known Allergies     Medication List    TAKE these medications   prenatal multivitamin Tabs tablet Take 1 tablet by mouth daily at 12 noon.      Donette Larry CNM 06/17/2017, 4:37 PM

## 2017-06-17 NOTE — Discharge Instructions (Signed)

## 2017-06-21 ENCOUNTER — Encounter: Payer: Self-pay | Admitting: Family Medicine

## 2017-07-04 ENCOUNTER — Telehealth (HOSPITAL_COMMUNITY): Payer: Self-pay | Admitting: *Deleted

## 2017-07-04 ENCOUNTER — Encounter (HOSPITAL_COMMUNITY): Payer: Self-pay | Admitting: *Deleted

## 2017-07-04 NOTE — Telephone Encounter (Signed)
Preadmission screen  

## 2017-07-10 ENCOUNTER — Other Ambulatory Visit: Payer: Self-pay

## 2017-07-10 ENCOUNTER — Inpatient Hospital Stay (HOSPITAL_COMMUNITY)
Admission: AD | Admit: 2017-07-10 | Discharge: 2017-07-13 | DRG: 807 | Disposition: A | Discharge status: Home / Self Care | Payer: Medicaid Other | Attending: Obstetrics and Gynecology | Admitting: Obstetrics and Gynecology

## 2017-07-10 ENCOUNTER — Encounter (HOSPITAL_COMMUNITY): Payer: Self-pay | Admitting: Emergency Medicine

## 2017-07-10 ENCOUNTER — Inpatient Hospital Stay (HOSPITAL_COMMUNITY)
Admission: RE | Admit: 2017-07-10 | Discharge: 2017-07-10 | Disposition: A | Discharge status: Home / Self Care | Payer: Medicaid Other | Source: Ambulatory Visit | Attending: Obstetrics and Gynecology | Admitting: Obstetrics and Gynecology

## 2017-07-10 DIAGNOSIS — O9952 Diseases of the respiratory system complicating childbirth: Secondary | ICD-10-CM | POA: Diagnosis present

## 2017-07-10 DIAGNOSIS — J45909 Unspecified asthma, uncomplicated: Secondary | ICD-10-CM | POA: Diagnosis present

## 2017-07-10 DIAGNOSIS — O480 Post-term pregnancy: Principal | ICD-10-CM | POA: Diagnosis present

## 2017-07-10 DIAGNOSIS — Z3A41 41 weeks gestation of pregnancy: Secondary | ICD-10-CM

## 2017-07-10 LAB — CBC
HEMATOCRIT: 40.6 % (ref 36.0–46.0)
Hemoglobin: 13.9 g/dL (ref 12.0–15.0)
MCH: 28.5 pg (ref 26.0–34.0)
MCHC: 34.2 g/dL (ref 30.0–36.0)
MCV: 83.4 fL (ref 78.0–100.0)
Platelets: 163 10*3/uL (ref 150–400)
RBC: 4.87 MIL/uL (ref 3.87–5.11)
RDW: 15.2 % (ref 11.5–15.5)
WBC: 8.6 10*3/uL (ref 4.0–10.5)

## 2017-07-10 LAB — TYPE AND SCREEN
ABO/RH(D): O POS
Antibody Screen: NEGATIVE

## 2017-07-10 LAB — RPR: RPR Ser Ql: NONREACTIVE

## 2017-07-10 LAB — ABO/RH: ABO/RH(D): O POS

## 2017-07-10 MED ORDER — ACETAMINOPHEN 325 MG PO TABS: 650.0000 mg | ORAL_TABLET | ORAL | Status: DC | PRN

## 2017-07-10 MED ORDER — LACTATED RINGERS IV SOLN
500.0000 mL | INTRAVENOUS | Status: DC | PRN
  Administered 2017-07-10: 500 mL via INTRAVENOUS

## 2017-07-10 MED ORDER — FENTANYL CITRATE (PF) 100 MCG/2ML IJ SOLN: 100.0000 ug | INTRAMUSCULAR | Status: DC | PRN

## 2017-07-10 MED ORDER — ONDANSETRON HCL 4 MG/2ML IJ SOLN
4.0000 mg | Freq: Four times a day (QID) | INTRAMUSCULAR | Status: DC | PRN
  Administered 2017-07-11: 4 mg via INTRAVENOUS
  Filled 2017-07-10: qty 2

## 2017-07-10 MED ORDER — TERBUTALINE SULFATE 1 MG/ML IJ SOLN
0.2500 mg | Freq: Once | INTRAMUSCULAR | Status: DC | PRN
  Filled 2017-07-10: qty 1

## 2017-07-10 MED ORDER — OXYTOCIN 40 UNITS IN LACTATED RINGERS INFUSION - SIMPLE MED
2.5000 [IU]/h | INTRAVENOUS | Status: DC
  Filled 2017-07-10 (×2): qty 1000

## 2017-07-10 MED ORDER — MISOPROSTOL 25 MCG QUARTER TABLET
25.0000 ug | ORAL_TABLET | ORAL | Status: DC | PRN
  Administered 2017-07-10 (×2): 25 ug via BUCCAL
  Filled 2017-07-10 (×4): qty 1

## 2017-07-10 MED ORDER — OXYCODONE-ACETAMINOPHEN 5-325 MG PO TABS: 2.0000 | ORAL_TABLET | ORAL | Status: DC | PRN

## 2017-07-10 MED ORDER — SOD CITRATE-CITRIC ACID 500-334 MG/5ML PO SOLN: 30.0000 mL | ORAL | Status: DC | PRN

## 2017-07-10 MED ORDER — OXYCODONE-ACETAMINOPHEN 5-325 MG PO TABS: 1.0000 | ORAL_TABLET | ORAL | Status: DC | PRN

## 2017-07-10 MED ORDER — LIDOCAINE HCL (PF) 1 % IJ SOLN
30.0000 mL | INTRAMUSCULAR | Status: DC | PRN
  Filled 2017-07-10: qty 30

## 2017-07-10 MED ORDER — LACTATED RINGERS IV SOLN
INTRAVENOUS | Status: DC
  Administered 2017-07-10 (×3): via INTRAVENOUS

## 2017-07-10 MED ORDER — OXYTOCIN 40 UNITS IN LACTATED RINGERS INFUSION - SIMPLE MED
1.0000 m[IU]/min | INTRAVENOUS | Status: DC
  Administered 2017-07-10: 2 m[IU]/min via INTRAVENOUS

## 2017-07-10 MED ORDER — OXYTOCIN BOLUS FROM INFUSION
500.0000 mL | Freq: Once | INTRAVENOUS | Status: AC
  Administered 2017-07-11: 500 mL via INTRAVENOUS

## 2017-07-10 NOTE — Progress Notes (Signed)
LABOR PROGRESS NOTE  Lindsey Harvey is a 27 y.o. G1P0000 at 712w3d  admitted for IOL due to postdates.   Subjective: Pt is comfortable. Not feeling contractions. Objective: BP 112/73   Pulse 95   Temp 98.9 F (37.2 C) (Oral)   Resp 14   Ht 5' (1.524 m)   Wt 159 lb (72.1 kg)   LMP 09/26/2016 (Approximate)   BMI 31.05 kg/m  or  Vitals:   07/10/17 1924 07/10/17 1931 07/10/17 2001 07/10/17 2031  BP:  114/75 111/68 112/73  Pulse:  (!) 101 98 95  Resp: 14  12 14   Temp: 98.9 F (37.2 C)     TempSrc: Oral     Weight:      Height:         Dilation: 3.5 Effacement (%): 70 Cervical Position: Middle Station: -3 Presentation: Vertex Exam by:: jennifer Hamilton RN FHT: 125, mod var, accel, no decel Uterine activity: 2-5 min  Labs: Lab Results  Component Value Date   WBC 8.6 07/10/2017   HGB 13.9 07/10/2017   HCT 40.6 07/10/2017   MCV 83.4 07/10/2017   PLT 163 07/10/2017    Patient Active Problem List   Diagnosis Date Noted  . Post-dates pregnancy 07/10/2017    Assessment / Plan: 27 y.o. G1P0000 at 512w3d here for IOL for postdates  Labor: early labor Fetal Wellbeing:  Cat I Pain Control:  Supportive now Anticipated MOD:  NSVD  Garnette GunnerAaron B Tiya Schrupp, MD PGY-1 6/16/20198:56 PM

## 2017-07-10 NOTE — H&P (Signed)
Lindsey Harvey is a 27 y.o. female G1 @ 41.3 wks presenting for IOL for pos dates.. OB History    Gravida  1   Para  0   Term  0   Preterm  0   AB  0   Living        SAB  0   TAB  0   Ectopic  0   Multiple      Live Births             Past Medical History:  Diagnosis Date  . Asthma   . Medical history non-contributory    Past Surgical History:  Procedure Laterality Date  . NO PAST SURGERIES     Family History: family history includes Hypertension in her mother. Social History:  reports that she has never smoked. She has never used smokeless tobacco. She reports that she does not drink alcohol or use drugs.     Maternal Diabetes: No Genetic Screening: Normal Maternal Ultrasounds/Referrals: Normal Fetal Ultrasounds or other Referrals:  None Maternal Substance Abuse:  No Significant Maternal Medications:  None Significant Maternal Lab Results:  None Other Comments:  None  Review of Systems  Constitutional: Negative.   HENT: Negative.   Eyes: Negative.   Respiratory: Negative.   Cardiovascular: Negative.   Gastrointestinal: Negative.   Genitourinary: Negative.   Musculoskeletal: Negative.   Skin: Negative.   Neurological: Negative.   Endo/Heme/Allergies: Negative.   Psychiatric/Behavioral: Negative.    Maternal Medical History:  Reason for admission: IOL for post dates  Contractions: none  Fetal activity: Perceived fetal activity is normal.   Last perceived fetal movement was within the past hour.    Prenatal complications: no prenatal complications Prenatal Complications - Diabetes: none.    Dilation: 1 Effacement (%): 60 Station: -3 Exam by:: Zerita Boersarlene Harvey Blood pressure 110/73, pulse 91, temperature 98.2 F (36.8 C), temperature source Oral, resp. rate 18, height 5' (1.524 m), weight 159 lb (72.1 kg), last menstrual period 09/26/2016. Maternal Exam:  Uterine Assessment: Contraction strength is mild.  Abdomen: Patient reports no abdominal  tenderness. Fetal presentation: vertex  Introitus: Normal vulva. Normal vagina.  Ferning test: not done.  Nitrazine test: not done. Amniotic fluid character: not assessed.  Pelvis: adequate for delivery.   Cervix: Cervix evaluated by digital exam.     Physical Exam  Constitutional: She is oriented to person, place, and time. She appears well-developed and well-nourished.  HENT:  Head: Normocephalic.  Eyes: Pupils are equal, round, and reactive to light.  Neck: Normal range of motion.  Cardiovascular: Normal rate, regular rhythm, normal heart sounds and intact distal pulses.  Respiratory: Effort normal and breath sounds normal.  GI: Soft. Bowel sounds are normal.  Genitourinary: Vagina normal and uterus normal.  Musculoskeletal: Normal range of motion.  Neurological: She is alert and oriented to person, place, and time. She has normal reflexes.  Skin: Skin is warm and dry.  Psychiatric: She has a normal mood and affect. Her behavior is normal. Thought content normal.    Prenatal labs: ABO, Rh: --/--/O POS (06/16 16100737) Antibody: NEG (06/16 0737) Rubella: Immune (01/10 0000) RPR: Nonreactive (01/10 0000)  HBsAg: Negative (01/10 0000)  HIV: Non-reactive (01/10 0000)  GBS: Negative (05/09 0000)   Assessment/Plan: SVE 1TH/soft/pos/-2 vertex. Cook cath introduced thru cervix and inflated with 60cc sterile H2O. Pt and fetus tolerated well.   Lindsey Harvey 07/10/2017, 9:33 AM

## 2017-07-10 NOTE — Anesthesia Pain Management Evaluation Note (Signed)
  CRNA Pain Management Visit Note  Patient: Lindsey Harvey, 27 y.o., female  "Hello I am a member of the anesthesia team at [facility]. We have an anesthesia team available at all times to provide care throughout the hospital, including epidural management and anesthesia for C-section. I don't know your plan for the delivery whether it a natural birth, water birth, IV sedation, nitrous supplementation, doula or epidural, but we want to meet your pain goals."   1.Was your pain managed to your expectations on prior hospitalizations?   No prior hospitalizations  2.What is your expectation for pain management during this hospitalization?     natural  3.How can we help you reach that goal? Patient would like natural childbirth  Record the patient's initial score and the patient's pain goal.   Pain: 0  Pain Goal: 10 [facility] wants you to be able to say your pain was always managed very well.  Rica RecordsICKELTON,Dariann Huckaba 07/10/2017

## 2017-07-10 NOTE — Progress Notes (Signed)
Lindsey Harvey is a 27 y.o. G1P0000 at 6185w3d by  admitted for induction of labor due to Post dates. Due date 6/6.  Subjective:   Objective: BP 116/71 (BP Location: Right Arm)   Pulse 91   Temp 98 F (36.7 C) (Oral)   Resp 18   Ht 5' (1.524 m)   Wt 159 lb (72.1 kg)   LMP 09/26/2016 (Approximate)   BMI 31.05 kg/m  No intake/output data recorded. No intake/output data recorded.  FHT:  145 UC:   regular, every 2-3 minutes SVE:   Dilation: 1 Effacement (%): 60 Station: -3 Exam by:: Lindsey Harvey  Labs: Lab Results  Component Value Date   WBC 8.6 07/10/2017   HGB 13.9 07/10/2017   HCT 40.6 07/10/2017   MCV 83.4 07/10/2017   PLT 163 07/10/2017    Assessment / Plan: Induction of labor due to postterm,  progressing well on pitocin  Labor: yet to be in labor Preeclampsia:  no signs or symptoms of toxicity Fetal Wellbeing:  Category I Pain Control:  Labor support without medications I/D:  n/a Anticipated MOD:  NSVD  Wyvonnia DuskyMarie Laure Leone 07/10/2017, 1:08 PM

## 2017-07-11 ENCOUNTER — Inpatient Hospital Stay (HOSPITAL_COMMUNITY): Payer: Medicaid Other | Admitting: Anesthesiology

## 2017-07-11 ENCOUNTER — Encounter (HOSPITAL_COMMUNITY): Payer: Self-pay | Admitting: *Deleted

## 2017-07-11 DIAGNOSIS — O480 Post-term pregnancy: Secondary | ICD-10-CM

## 2017-07-11 DIAGNOSIS — Z3A41 41 weeks gestation of pregnancy: Secondary | ICD-10-CM

## 2017-07-11 MED ORDER — BENZOCAINE-MENTHOL 20-0.5 % EX AERO
1.0000 "application " | INHALATION_SPRAY | CUTANEOUS | Status: DC | PRN
  Filled 2017-07-11: qty 56

## 2017-07-11 MED ORDER — WITCH HAZEL-GLYCERIN EX PADS: 1.0000 "application " | MEDICATED_PAD | CUTANEOUS | Status: DC | PRN

## 2017-07-11 MED ORDER — DIBUCAINE 1 % RE OINT: 1.0000 "application " | TOPICAL_OINTMENT | RECTAL | Status: DC | PRN

## 2017-07-11 MED ORDER — SIMETHICONE 80 MG PO CHEW: 80.0000 mg | CHEWABLE_TABLET | ORAL | Status: DC | PRN

## 2017-07-11 MED ORDER — PRENATAL MULTIVITAMIN CH
1.0000 | ORAL_TABLET | Freq: Every day | ORAL | Status: DC
  Administered 2017-07-12 – 2017-07-13 (×2): 1 via ORAL
  Filled 2017-07-11 (×2): qty 1

## 2017-07-11 MED ORDER — PHENYLEPHRINE 40 MCG/ML (10ML) SYRINGE FOR IV PUSH (FOR BLOOD PRESSURE SUPPORT)
80.0000 ug | PREFILLED_SYRINGE | INTRAVENOUS | Status: DC | PRN
  Filled 2017-07-11: qty 5

## 2017-07-11 MED ORDER — ONDANSETRON HCL 4 MG PO TABS: 4.0000 mg | ORAL_TABLET | ORAL | Status: DC | PRN

## 2017-07-11 MED ORDER — COCONUT OIL OIL
1.0000 "application " | TOPICAL_OIL | Status: DC | PRN
  Administered 2017-07-12: 1 via TOPICAL
  Filled 2017-07-11: qty 120

## 2017-07-11 MED ORDER — ZOLPIDEM TARTRATE 5 MG PO TABS: 5.0000 mg | ORAL_TABLET | Freq: Every evening | ORAL | Status: DC | PRN

## 2017-07-11 MED ORDER — PHENYLEPHRINE 40 MCG/ML (10ML) SYRINGE FOR IV PUSH (FOR BLOOD PRESSURE SUPPORT): 80.0000 ug | PREFILLED_SYRINGE | INTRAVENOUS | Status: DC | PRN

## 2017-07-11 MED ORDER — TETANUS-DIPHTH-ACELL PERTUSSIS 5-2.5-18.5 LF-MCG/0.5 IM SUSP: 0.5000 mL | Freq: Once | INTRAMUSCULAR | Status: DC

## 2017-07-11 MED ORDER — ACETAMINOPHEN 325 MG PO TABS: 650.0000 mg | ORAL_TABLET | ORAL | Status: DC | PRN

## 2017-07-11 MED ORDER — LIDOCAINE HCL (PF) 1 % IJ SOLN
INTRAMUSCULAR | Status: DC | PRN
  Administered 2017-07-11: 3 mL via EPIDURAL
  Administered 2017-07-11: 5 mL via EPIDURAL

## 2017-07-11 MED ORDER — EPHEDRINE 5 MG/ML INJ
10.0000 mg | INTRAVENOUS | Status: DC | PRN
  Filled 2017-07-11: qty 2

## 2017-07-11 MED ORDER — EPHEDRINE 5 MG/ML INJ: 10.0000 mg | INTRAVENOUS | Status: DC | PRN

## 2017-07-11 MED ORDER — IBUPROFEN 600 MG PO TABS
600.0000 mg | ORAL_TABLET | Freq: Four times a day (QID) | ORAL | Status: DC
  Administered 2017-07-11 – 2017-07-13 (×8): 600 mg via ORAL
  Filled 2017-07-11 (×8): qty 1

## 2017-07-11 MED ORDER — DIPHENHYDRAMINE HCL 50 MG/ML IJ SOLN: 12.5000 mg | INTRAMUSCULAR | Status: DC | PRN

## 2017-07-11 MED ORDER — SENNOSIDES-DOCUSATE SODIUM 8.6-50 MG PO TABS
2.0000 | ORAL_TABLET | ORAL | Status: DC
  Administered 2017-07-12 – 2017-07-13 (×2): 2 via ORAL
  Filled 2017-07-11 (×2): qty 2

## 2017-07-11 MED ORDER — ONDANSETRON HCL 4 MG/2ML IJ SOLN: 4.0000 mg | INTRAMUSCULAR | Status: DC | PRN

## 2017-07-11 MED ORDER — FENTANYL 2.5 MCG/ML BUPIVACAINE 1/10 % EPIDURAL INFUSION (WH - ANES)
14.0000 mL/h | INTRAMUSCULAR | Status: DC | PRN
  Administered 2017-07-11: 10 mL/h via EPIDURAL
  Filled 2017-07-11: qty 100

## 2017-07-11 MED ORDER — LACTATED RINGERS IV SOLN
500.0000 mL | Freq: Once | INTRAVENOUS | Status: AC
  Administered 2017-07-11: 500 mL via INTRAVENOUS

## 2017-07-11 MED ORDER — DIPHENHYDRAMINE HCL 25 MG PO CAPS
25.0000 mg | ORAL_CAPSULE | Freq: Four times a day (QID) | ORAL | Status: DC | PRN
Start: 2017-07-11 — End: 2017-07-13
  Administered 2017-07-11: 25 mg via ORAL
  Filled 2017-07-11: qty 1

## 2017-07-11 MED ORDER — PHENYLEPHRINE 40 MCG/ML (10ML) SYRINGE FOR IV PUSH (FOR BLOOD PRESSURE SUPPORT)
80.0000 ug | PREFILLED_SYRINGE | INTRAVENOUS | Status: DC | PRN
  Filled 2017-07-11: qty 5
  Filled 2017-07-11: qty 10

## 2017-07-11 NOTE — Anesthesia Preprocedure Evaluation (Signed)
Anesthesia Evaluation  Patient identified by MRN, date of birth, ID band Patient awake    Reviewed: Allergy & Precautions, NPO status , Patient's Chart, lab work & pertinent test results  History of Anesthesia Complications Negative for: history of anesthetic complications  Airway Mallampati: III  TM Distance: >3 FB Neck ROM: Full    Dental  (+) Dental Advisory Given   Pulmonary neg pulmonary ROS,    breath sounds clear to auscultation       Cardiovascular negative cardio ROS   Rhythm:Regular Rate:Normal     Neuro/Psych negative neurological ROS     GI/Hepatic Neg liver ROS, GERD  ,  Endo/Other  negative endocrine ROS  Renal/GU negative Renal ROS     Musculoskeletal   Abdominal   Peds  Hematology plt 163k   Anesthesia Other Findings   Reproductive/Obstetrics (+) Pregnancy                             Anesthesia Physical Anesthesia Plan  ASA: II  Anesthesia Plan: Epidural   Post-op Pain Management:    Induction:   PONV Risk Score and Plan: 2 and Treatment may vary due to age or medical condition  Airway Management Planned: Natural Airway  Additional Equipment:   Intra-op Plan:   Post-operative Plan:   Informed Consent: I have reviewed the patients History and Physical, chart, labs and discussed the procedure including the risks, benefits and alternatives for the proposed anesthesia with the patient or authorized representative who has indicated his/her understanding and acceptance.   Dental advisory given  Plan Discussed with:   Anesthesia Plan Comments: (Patient identified. Risks/Benefits/Options discussed with patient including but not limited to bleeding, infection, nerve damage, paralysis, failed block, incomplete pain control, headache, blood pressure changes, nausea, vomiting, reactions to medication both or allergic, itching and postpartum back pain. Confirmed with  bedside nurse the patient's most recent platelet count. Confirmed with patient that they are not currently taking any anticoagulation, have any bleeding history or any family history of bleeding disorders. Patient expressed understanding and wished to proceed. All questions were answered. )        Anesthesia Quick Evaluation

## 2017-07-11 NOTE — Progress Notes (Signed)
LABOR PROGRESS NOTE  Lindsey Harvey is a 27 y.o. G1P0000 at 7135w3d  admitted for IOL due to postdates.   Subjective: Pt is comfortable with epidural.   Objective: BP 118/83   Pulse 97   Temp 98.8 F (37.1 C) (Oral)   Resp 14   Ht 5' (1.524 m)   Wt 159 lb (72.1 kg)   LMP 09/26/2016 (Approximate)   SpO2 99%   BMI 31.05 kg/m  or  Vitals:   07/11/17 0430 07/11/17 0500 07/11/17 0530 07/11/17 0600  BP: 106/70 112/70 107/69 118/83  Pulse: 90 90 91 97  Resp: 14 12 12 14   Temp:    98.8 F (37.1 C)  TempSrc:    Oral  SpO2:      Weight:      Height:        Dilation: 7 Effacement (%): 90 Cervical Position: Middle Station: -1 Presentation: Vertex Exam by:: Dr Janee Mornhompson FHT: 130, mod var, reactive accel, no decel Uterine activity: 1-2 min  Labs: Lab Results  Component Value Date   WBC 8.6 07/10/2017   HGB 13.9 07/10/2017   HCT 40.6 07/10/2017   MCV 83.4 07/10/2017   PLT 163 07/10/2017    Patient Active Problem List   Diagnosis Date Noted  . Post-dates pregnancy 07/10/2017    Assessment / Plan: 27 y.o. G1P0000 at 2835w3d here for IOL for postdates  Labor: active labor, progressing well on Pit. S/p AROM w/ mod mec Fetal Wellbeing:  Cat I Pain Control:  epidural Anticipated MOD:  NSVD  Garnette GunnerAaron B Thompson, MD PGY-1 6/17/20196:05 AM

## 2017-07-11 NOTE — Progress Notes (Signed)
LABOR PROGRESS NOTE  Lindsey Harvey is a 27 y.o. G1P0000 at 2015w3d  admitted for IOL due to postdates.   Subjective: Pt is comfortable with epidural  Objective: BP 112/70   Pulse 90   Temp 98.6 F (37 C) (Oral)   Resp 12   Ht 5' (1.524 m)   Wt 159 lb (72.1 kg)   LMP 09/26/2016 (Approximate)   SpO2 99%   BMI 31.05 kg/m  or  Vitals:   07/11/17 0330 07/11/17 0400 07/11/17 0430 07/11/17 0500  BP: 106/71 106/67 106/70 112/70  Pulse: 90 91 90 90  Resp: 14 12 14 12   Temp:      TempSrc:      SpO2:      Weight:      Height:        Dilation: 6 Effacement (%): 80 Cervical Position: Middle Station: -2 Presentation: Vertex Exam by:: Arne ClevelandJennifer Hazelwood, RN FHT: 140, mod var, reactive accel, no decel Uterine activity: 1-2 min  Labs: Lab Results  Component Value Date   WBC 8.6 07/10/2017   HGB 13.9 07/10/2017   HCT 40.6 07/10/2017   MCV 83.4 07/10/2017   PLT 163 07/10/2017    Patient Active Problem List   Diagnosis Date Noted  . Post-dates pregnancy 07/10/2017    Assessment / Plan: 27 y.o. G1P0000 at 1615w3d here for IOL for postdates  Labor: active labor, progressing well on pit, will likely AROM if cont to make change on cervical check after resting Fetal Wellbeing:  Cat I Pain Control:  epidural Anticipated MOD:  NSVD  Garnette GunnerAaron B Platon Arocho, MD PGY-1 6/17/20195:21 AM

## 2017-07-11 NOTE — Anesthesia Postprocedure Evaluation (Signed)
Anesthesia Post Note  Patient: Lindsey Harvey  Procedure(s) Performed: AN AD HOC LABOR EPIDURAL     Patient location during evaluation: Mother Baby Anesthesia Type: Epidural Level of consciousness: awake, awake and alert and oriented Pain management: pain level controlled Vital Signs Assessment: post-procedure vital signs reviewed and stable Respiratory status: spontaneous breathing, nonlabored ventilation and respiratory function stable Cardiovascular status: stable Postop Assessment: no headache, no backache, patient able to bend at knees, no apparent nausea or vomiting and adequate PO intake (Patient has not walked but can bend knees. Block is resolving.) Anesthetic complications: no    Last Vitals:  Vitals:   07/11/17 1431 07/11/17 1545  BP: 93/68 102/75  Pulse: (!) 108 (!) 117  Resp:  18  Temp: 37.5 C 37.5 C  SpO2:      Last Pain:  Vitals:   07/11/17 1545  TempSrc: Oral  PainSc: 0-No pain   Pain Goal:                 Juliani Laduke

## 2017-07-11 NOTE — Progress Notes (Signed)
LABOR PROGRESS NOTE  Lindsey Harvey is a 27 y.o. G1P0000 at 5827w3d  admitted for IOL due to postdates.   Subjective: Pt is feeling more pressure, starting practice pushes.    Objective: BP 109/73   Pulse 97   Temp 98.9 F (37.2 C) (Oral)   Resp 18   Ht 5' (1.524 m)   Wt 72.1 kg (159 lb)   LMP 09/26/2016 (Approximate)   SpO2 99%   BMI 31.05 kg/m  or  Vitals:   07/11/17 0900 07/11/17 0931 07/11/17 1001 07/11/17 1031  BP: 113/75 105/67 (!) 108/57 109/73  Pulse: (!) 102 98 99 97  Resp:  18    Temp:   98.9 F (37.2 C)   TempSrc:   Oral   SpO2:      Weight:      Height:        Dilation: 10 Dilation Complete Date: 07/11/17 Dilation Complete Time: 1031 Effacement (%): 100 Cervical Position: Middle Station: Plus 2 Presentation: Vertex Exam by:: Earlene Plateravis, RN/ [place], RN FHT: 130, mod var, reactive accel, no decel Uterine activity: 1-2 min  Labs: Lab Results  Component Value Date   WBC 8.6 07/10/2017   HGB 13.9 07/10/2017   HCT 40.6 07/10/2017   MCV 83.4 07/10/2017   PLT 163 07/10/2017    Patient Active Problem List   Diagnosis Date Noted  . Post-dates pregnancy 07/10/2017    Assessment / Plan: 27 y.o. G1P0000 at 6927w3d here for IOL for postdates  Labor: Now complete, practice pushing, anticipate SVD Fetal Wellbeing:  Cat I Pain Control:  epidural  Lindsey GageBrittany P Jazper Nikolai, MD PGY-1 6/17/201910:39 AM

## 2017-07-11 NOTE — Progress Notes (Signed)
LABOR PROGRESS NOTE  Lindsey Harvey is a 27 y.o. G1P0000 at 5239w3d  admitted for IOL due to postdates.   Subjective: Pt is uncomfortable with contractions. Considering epidural.  Objective: BP (!) 105/56   Pulse 87   Temp 98.7 F (37.1 C) (Oral)   Resp 14   Ht 5' (1.524 m)   Wt 159 lb (72.1 kg)   LMP 09/26/2016 (Approximate)   BMI 31.05 kg/m  or  Vitals:   07/10/17 2331 07/11/17 0001 07/11/17 0033 07/11/17 0101  BP: 109/73 107/72 111/70 (!) 105/56  Pulse: 89 92 (!) 102 87  Resp: 16 14 16 14   Temp:      TempSrc:      Weight:      Height:         Dilation: 4 Effacement (%): 70 Cervical Position: Middle Station: -3 Presentation: Vertex Exam by:: Arne ClevelandJennifer Hazelwood, RN FHT: 130, mod var, reactive accel, no decel Uterine activity: 2-3 min  Labs: Lab Results  Component Value Date   WBC 8.6 07/10/2017   HGB 13.9 07/10/2017   HCT 40.6 07/10/2017   MCV 83.4 07/10/2017   PLT 163 07/10/2017    Patient Active Problem List   Diagnosis Date Noted  . Post-dates pregnancy 07/10/2017    Assessment / Plan: 27 y.o. G1P0000 at 7639w3d here for IOL for postdates  Labor: early labor Fetal Wellbeing:  Cat I Pain Control:  Considering epidural Anticipated MOD:  NSVD  Garnette GunnerAaron B Breslin Hemann, MD PGY-1 6/17/20191:09 AM

## 2017-07-11 NOTE — Lactation Note (Signed)
This note was copied from a baby's chart. Lactation Consultation Note  Patient Name: Lindsey Harvey Reason for consult: Initial assessment;Primapara;1st time breastfeeding;Term  P1 mother whose infant is now 368 hours old.    RN had assisted with latching baby as I arrived.  Baby is latched effectively in the football hold on the right breast.  Lips are flanged and good rhythmic sucking noted.  RN alerted me that mother had been also asking for a bottle.  LEAD teaching completed by RN.  I spoke with mother about the benefits of breastfeeding and encouraged her to continue breastfeeding.  I praised her efforts and showed her how well baby was latching and sucking.  Demonstrated breast compressions during feeding.  Explained tummy size of the newborn and the important qualities of breastmilk.  When baby released after 10 minutes she was content doing STS.  Mother still stated that she wants to bottle feed also.  Informed her to call her nurse when she determines the need for a bottle.  I also taught hand expression and demonstrated collection on the left breast.  Colostrum container provided.    RN updated.   Maternal Data Formula Feeding for Exclusion: No Has patient been taught Hand Expression?: Yes Does the patient have breastfeeding experience prior to this delivery?: No  Feeding Feeding Type: Breast Fed Length of feed: 12 min  LATCH Score Latch: Grasps breast easily, tongue down, lips flanged, rhythmical sucking.  Audible Swallowing: None  Type of Nipple: Everted at rest and after stimulation  Comfort (Breast/Nipple): Soft / non-tender  Hold (Positioning): Assistance needed to correctly position infant at breast and maintain latch.  LATCH Score: 7  Interventions Interventions: Breast feeding basics reviewed;Assisted with latch;Skin to skin;Breast massage;Hand express  Lactation Tools Discussed/Used     Consult Status Consult Status:  Follow-up Date: 07/12/17 Follow-up type: In-patient    Lindsey Harvey Harvey, 8:42 PM

## 2017-07-11 NOTE — Anesthesia Procedure Notes (Signed)
Epidural Patient location during procedure: OB Start time: 07/11/2017 1:45 AM End time: 07/11/2017 2:02 AM  Staffing Anesthesiologist: Jairo BenJackson, Brynnlie Unterreiner, MD Performed: anesthesiologist   Preanesthetic Checklist Completed: patient identified, surgical consent, pre-op evaluation, timeout performed, IV checked, risks and benefits discussed and monitors and equipment checked  Epidural Patient position: sitting Prep: site prepped and draped and DuraPrep Patient monitoring: blood pressure, continuous pulse ox and heart rate Approach: midline Location: L3-L4 Injection technique: LOR air  Needle:  Needle type: Tuohy  Needle gauge: 17 G Needle length: 9 cm Needle insertion depth: 4 cm Catheter type: closed end flexible Catheter size: 19 Gauge Catheter at skin depth: 9.5 cm Test dose: negative (1% lidocaine)  Assessment Events: blood not aspirated, injection not painful, no injection resistance, negative IV test and no paresthesia  Additional Notes Pt identified in Labor room.  Monitors applied. Working IV access confirmed. Sterile prep, drape lumbar spine.  1% lido local L 3,4.  #17ga Touhy LOR air at 4 cm L 3,4, cath in easily to 9.5 cm skin. Test dose OK, cath dosed and infusion begun.  Patient asymptomatic, VSS, no heme aspirated, tolerated well.  Lindsey Harvey, MDReason for block:procedure for pain

## 2017-07-12 NOTE — Progress Notes (Addendum)
POSTPARTUM PROGRESS NOTE  Post Partum Day 1  Subjective:  Lindsey Harvey is a 27 y.o. G1P1001 s/p SVD at 723w4d.  She reports she is doing well. No acute events overnight. She denies any problems with ambulating, voiding or po intake. Denies nausea or vomiting.  Pain is well controlled.  Lochia is normal.  Objective: Blood pressure 109/76, pulse 90, temperature 98.4 F (36.9 C), resp. rate 18, height 5' (1.524 m), weight 72.1 kg (159 lb), SpO2 99 %, unknown if currently breastfeeding.  Physical Exam:  General: alert, cooperative and no distress Chest: no respiratory distress Heart:regular rate, distal pulses intact Abdomen: soft, nontender,  Uterine Fundus: firm, appropriately tender DVT Evaluation: No calf swelling or tenderness Extremities: no edema Skin: warm, dry  Recent Labs    07/10/17 0737  HGB 13.9  HCT 40.6    Assessment/Plan: Lindsey Harvey is a 27 y.o. G1P1001 s/p SVD at 4923w4d   PPD#1 - Doing well. Routine postpartum care Contraception: POPs Feeding: breast and bottle Dispo: Plan for discharge tomorrow on PPD2.   LOS: 2 days   GrenadaBrittany P HipkinsMD 07/12/2017, 7:18 AM

## 2017-07-13 MED ORDER — IBUPROFEN 800 MG PO TABS: 800.0000 mg | ORAL_TABLET | Freq: Three times a day (TID) | ORAL | 1 refills | Status: DC

## 2017-07-13 MED ORDER — DIBUCAINE 1 % RE OINT: 1.0000 "application " | TOPICAL_OINTMENT | RECTAL | 0 refills | Status: DC | PRN

## 2017-07-13 MED ORDER — NORETHINDRONE 0.35 MG PO TABS: 1.0000 | ORAL_TABLET | Freq: Every day | ORAL | 11 refills | Status: DC

## 2017-07-13 NOTE — Progress Notes (Signed)
Post Partum Day #2 Subjective: no complaints, up ad lib, voiding and tolerating PO  Objective: Blood pressure 112/66, pulse 70, temperature 98.4 F (36.9 C), temperature source Oral, resp. rate 18, height 5' (1.524 m), weight 159 lb (72.1 kg), SpO2 100 %, unknown if currently breastfeeding.  Physical Exam:  General: alert, cooperative and no distress Lochia: appropriate Uterine Fundus: firm Incision: n/a DVT Evaluation: No evidence of DVT seen on physical exam. No cords or calf tenderness. No significant calf/ankle edema.  No results for input(s): HGB, HCT in the last 72 hours.  Assessment/Plan: Discharge home, Breastfeeding and Contraception POP.   LOS: 3 days   Roe CoombsRachelle A Denney, CNM 07/13/2017, 11:56 AM

## 2017-07-13 NOTE — Discharge Summary (Signed)
OB Discharge Summary     Patient Name: Lindsey Harvey DOB: 1990/10/02 MRN: [id number]  Date of admission: 07/10/2017 Delivering MD: Lilly Cove P   Date of discharge: 07/13/2017  Admitting diagnosis: INDUCTION Intrauterine pregnancy: [redacted]w[redacted]d     Secondary diagnosis:  Active Problems:   Post-dates pregnancy   Spontaneous vaginal delivery IOL for: Postdates  Additional problems: labial swelling: stable/resolving     Discharge diagnosis: Post-dates pregnancy: IOL, delivered SVD                                                                                                Post partum procedures:none  Augmentation: AROM, Pitocin and Foley Balloon  Complications: None  Hospital course:  Induction of Labor With Vaginal Delivery   27 y.o. yo G1P1001 at [redacted]w[redacted]d was admitted to the hospital 07/10/2017 for induction of labor.  Indication for induction: Postdates.  Patient had an uncomplicated labor course as follows: Membrane Rupture Time/Date: 6:01 AM ,07/11/2017   Intrapartum Procedures: Episiotomy: None [1]                                         Lacerations:  Perineal [11]  Patient had delivery of a Viable infant.  Information for the patient's newborn:  Consiglio, Girl Yatziry [[id number]]  Delivery Method: Vaginal, Spontaneous(Filed from Delivery Summary)   07/11/2017  Details of delivery can be found in separate delivery note.  Patient had a routine postpartum course. Patient is discharged home 07/13/17.  Physical exam  Vitals:   07/12/17 0500 07/12/17 1526 07/12/17 2242 07/13/17 0500  BP: 109/76 122/79 109/72 112/66  Pulse: 90 78 96 70  Resp:  16 18   Temp: 98.4 F (36.9 C) 98.4 F (36.9 C) 98.7 F (37.1 C) 98.4 F (36.9 C)  TempSrc:  Oral Oral Oral  SpO2:   100%   Weight:      Height:       General: alert, cooperative and no distress Lochia: appropriate Uterine Fundus: firm Incision: N/A DVT Evaluation: No evidence of DVT seen on physical exam. No cords or calf  tenderness. No significant calf/ankle edema. Labs: Lab Results  Component Value Date   WBC 8.6 07/10/2017   HGB 13.9 07/10/2017   HCT 40.6 07/10/2017   MCV 83.4 07/10/2017   PLT 163 07/10/2017   No flowsheet data found.  Discharge instruction: per After Visit Summary and "Baby and Me Booklet".  After visit meds:  Allergies as of 07/13/2017   No Known Allergies     Medication List    TAKE these medications   albuterol 108 (90 Base) MCG/ACT inhaler Commonly known as:  PROVENTIL HFA;VENTOLIN HFA Inhale 2 puffs into the lungs every 4 (four) hours as needed for wheezing or shortness of breath (cough, shortness of breath or wheezing.).   dibucaine 1 % Oint Commonly known as:  NUPERCAINAL Place 1 application rectally as needed for hemorrhoids.   ibuprofen 800 MG tablet Commonly known as:  ADVIL,MOTRIN Take 1 tablet (800 mg total) by  mouth 3 (three) times daily.   norethindrone 0.35 MG tablet Commonly known as:  MICRONOR,CAMILA,ERRIN Take 1 tablet (0.35 mg total) by mouth daily.   polyethylene glycol powder powder Commonly known as:  GLYCOLAX/MIRALAX Take 17 g by mouth daily as needed for mild constipation.   PRENATAL MULTIVITAMIN PLUS DHA 27-0.8-250 MG Caps Take 1 capsule by mouth daily.       Diet: routine diet  Activity: Advance as tolerated. Pelvic rest for 6 weeks.   Outpatient follow up:6 weeks Follow up Appt:No future appointments. Follow up Visit:No follow-ups on file.  Postpartum contraception: Progesterone only pills, Rx sent  Newborn Data: Live born female  Birth Weight: 7 lb 10.4 oz (3470 g) APGAR: 8, 9  Newborn Delivery   Birth date/time:  07/11/2017 12:18:00 Delivery type:  Vaginal, Spontaneous     Baby Feeding: Bottle and Breast Disposition:home with mother   07/13/2017 Roe Coombsachelle A Jasira Robinson, CNM

## 2018-09-04 ENCOUNTER — Other Ambulatory Visit: Payer: Self-pay

## 2018-09-04 DIAGNOSIS — Z20822 Contact with and (suspected) exposure to covid-19: Secondary | ICD-10-CM

## 2018-09-05 LAB — NOVEL CORONAVIRUS, NAA: SARS-CoV-2, NAA: NOT DETECTED

## 2019-01-12 ENCOUNTER — Other Ambulatory Visit: Payer: Self-pay

## 2019-01-12 DIAGNOSIS — Z20822 Contact with and (suspected) exposure to covid-19: Secondary | ICD-10-CM

## 2019-01-13 LAB — NOVEL CORONAVIRUS, NAA: SARS-CoV-2, NAA: NOT DETECTED

## 2019-01-18 ENCOUNTER — Ambulatory Visit: Payer: HRSA Program | Attending: Internal Medicine

## 2019-01-18 DIAGNOSIS — Z20822 Contact with and (suspected) exposure to covid-19: Secondary | ICD-10-CM

## 2019-01-18 DIAGNOSIS — Z20828 Contact with and (suspected) exposure to other viral communicable diseases: Secondary | ICD-10-CM | POA: Diagnosis present

## 2019-01-18 DIAGNOSIS — U071 COVID-19: Secondary | ICD-10-CM | POA: Insufficient documentation

## 2019-01-19 LAB — NOVEL CORONAVIRUS, NAA: SARS-CoV-2, NAA: DETECTED — AB

## 2019-02-01 ENCOUNTER — Ambulatory Visit: Payer: Self-pay | Attending: Internal Medicine

## 2019-02-01 DIAGNOSIS — Z20822 Contact with and (suspected) exposure to covid-19: Secondary | ICD-10-CM | POA: Insufficient documentation

## 2019-02-03 LAB — NOVEL CORONAVIRUS, NAA: SARS-CoV-2, NAA: NOT DETECTED

## 2023-01-26 NOTE — L&D Delivery Note (Addendum)
 LABOR COURSE Saliah started laboring at 2042 on 10/31/23 and AROM'd at 14:10 that day. She continued to progress and was complete by 0436 on 11/01/23. She spontaneously delivered a viable female at 14 who required 4 minutes of neonatal resuscitation with suctioning, oxygen, and CPAP. APGARs 5,9 and infant is breastfeeding now. Placenta delivered spontaneously at 0533 after gentle traction due to fragile cord.   Delivery Note Called to room and patient was complete and pushing. Head delivered MOA. No nuchal cord present. Shoulder and body delivered in usual fashion. At 0520 a viable female was delivered via Vaginal, Spontaneous (Presentation: MOA 6;9 ).  Infant without spontaneous cry, placed on mother's abdomen, dried and stimulated, 1 minute APGAR was 5. Neonatal resuscitation team was called when infant did not cry, but after oxygen, suctioning, and CPAP, 5 minute APGAR was 9. Cord clamped x 2 after 1-minute delay, and cut by FOB. Cord blood drawn. Placenta delivered spontaneously with gentle traction as cord was too fragile to use for traction. Appeared intact, but continued bleeding prompted exploration for trailing membranes. Uterus was swept and found to have more placental tissue, thought to be additional lobe. Fundus firm with massage and Pitocin . Labia, perineum, vagina, and cervix inspected manually and a 2nd degree perineal tear was repaired in the usual fashion.    APGAR: 5, 9; weight  6lb 6oz  Following cord complications: 3 vessel, velamentous cord.   Cord pH: venous pH pending  Anesthesia:   Episiotomy: None Lacerations: 2nd degree perineal Suture Repair: 3.0 vicryl Est. Blood Loss (mL): 938  Mom to postpartum.  Baby to Couplet care / Skin to Skin.  Kirsteins, Aleksa, MD Family Medicine Resident PGY-1  11/01/23 6:57 AM    Midwife attestation: I was gloved and present for delivery in its entirety and I agree with the above resident's note.    Olam Boards, CNM 7:31  AM

## 2023-02-26 DIAGNOSIS — Z419 Encounter for procedure for purposes other than remedying health state, unspecified: Secondary | ICD-10-CM | POA: Diagnosis not present

## 2023-03-22 ENCOUNTER — Telehealth: Payer: Self-pay | Admitting: *Deleted

## 2023-03-22 NOTE — Telephone Encounter (Signed)
 Returned call from 9:53 AM. Left patient a message of days and times to come fill out a record release form.

## 2023-03-26 DIAGNOSIS — Z419 Encounter for procedure for purposes other than remedying health state, unspecified: Secondary | ICD-10-CM | POA: Diagnosis not present

## 2023-04-08 ENCOUNTER — Encounter: Payer: Self-pay | Admitting: *Deleted

## 2023-04-08 DIAGNOSIS — O0993 Supervision of high risk pregnancy, unspecified, third trimester: Secondary | ICD-10-CM | POA: Insufficient documentation

## 2023-04-08 DIAGNOSIS — Z3490 Encounter for supervision of normal pregnancy, unspecified, unspecified trimester: Secondary | ICD-10-CM | POA: Insufficient documentation

## 2023-04-13 ENCOUNTER — Other Ambulatory Visit (HOSPITAL_COMMUNITY)
Admission: RE | Admit: 2023-04-13 | Discharge: 2023-04-13 | Disposition: A | Discharge status: Home / Self Care | Source: Ambulatory Visit | Attending: Obstetrics and Gynecology | Admitting: Obstetrics and Gynecology

## 2023-04-13 ENCOUNTER — Ambulatory Visit (INDEPENDENT_AMBULATORY_CARE_PROVIDER_SITE_OTHER): Payer: Medicaid Other | Admitting: Obstetrics and Gynecology

## 2023-04-13 ENCOUNTER — Ambulatory Visit (INDEPENDENT_AMBULATORY_CARE_PROVIDER_SITE_OTHER)

## 2023-04-13 ENCOUNTER — Encounter: Payer: Self-pay | Admitting: Obstetrics and Gynecology

## 2023-04-13 VITALS — BP 116/73 | HR 83 | Wt 133.0 lb

## 2023-04-13 DIAGNOSIS — Z3490 Encounter for supervision of normal pregnancy, unspecified, unspecified trimester: Secondary | ICD-10-CM

## 2023-04-13 DIAGNOSIS — Z3A10 10 weeks gestation of pregnancy: Secondary | ICD-10-CM

## 2023-04-13 DIAGNOSIS — O99011 Anemia complicating pregnancy, first trimester: Secondary | ICD-10-CM

## 2023-04-13 DIAGNOSIS — Z1339 Encounter for screening examination for other mental health and behavioral disorders: Secondary | ICD-10-CM | POA: Diagnosis not present

## 2023-04-13 DIAGNOSIS — Z3481 Encounter for supervision of other normal pregnancy, first trimester: Secondary | ICD-10-CM

## 2023-04-13 DIAGNOSIS — J4520 Mild intermittent asthma, uncomplicated: Secondary | ICD-10-CM

## 2023-04-13 DIAGNOSIS — D649 Anemia, unspecified: Secondary | ICD-10-CM

## 2023-04-13 MED ORDER — ALBUTEROL SULFATE HFA 108 (90 BASE) MCG/ACT IN AERS: 2.0000 | INHALATION_SPRAY | Freq: Four times a day (QID) | RESPIRATORY_TRACT | 2 refills | Status: AC | PRN

## 2023-04-13 NOTE — Progress Notes (Signed)
 Subjective:   Lindsey Harvey is a 33 y.o. G2P1001 at [redacted]w[redacted]d by LMP c/w [place] today being seen today for her first obstetrical visit.  Her obstetrical history is significant for  none . Pregnancy history fully reviewed term SVD 7#10oz, no complications  Patient reports no complaints.  HISTORY: OB History  Gravida Para Term Preterm AB Living  2 1 1  0 0 1  SAB IAB Ectopic Multiple Live Births  0 0 0 0 1    # Outcome Date GA Lbr Len/2nd Weight Sex Type Anes PTL Lv  2 Current           1 Term 07/11/17 [redacted]w[redacted]d 11:31 / 01:47 7 lb 10.4 oz (3.47 kg) F Vag-Spont EPI  LIV     Name: Partch,GIRL Ludia     Apgar1: 8  Apgar5: 9     Last pap smear: No results found for: "DIAGPAP", "HPV", "HPVHIGH"  Past Medical History:  Diagnosis Date   Asthma    Past Surgical History:  Procedure Laterality Date   NO PAST SURGERIES     Family History  Problem Relation Age of Onset   Hypertension Mother    Social History   Tobacco Use   Smoking status: Never   Smokeless tobacco: Never  Substance Use Topics   Alcohol use: Never   Drug use: Never   No Known Allergies Current Outpatient Medications on File Prior to Visit  Medication Sig Dispense Refill   Prenatal MV-Min-Fe Fum-FA-DHA (PRENATAL MULTIVITAMIN PLUS DHA) 27-0.8-250 MG CAPS Take 1 capsule by mouth daily. (Patient not taking: Reported on 04/13/2023) 90 capsule 3   No current facility-administered medications on file prior to visit.   Exam   Vitals:   04/13/23 1004  BP: 116/73  Pulse: 83  Weight: 133 lb (60.3 kg)    General:  Alert, oriented and cooperative. Patient is in no acute distress.  Breast: Deferred  Cardiovascular: Normal heart rate noted  Respiratory: Normal respiratory effort, no problems with respiration noted  Abdomen: Soft, gravid, appropriate for gestational age.  Pain/Pressure: Absent     Pelvic: Performed in presence of chaperone. Cervix visually normal. Pap collected without issue        Extremities: Normal range of  motion.  Edema: None  Mental Status: Normal mood and affect. Normal behavior. Normal judgment and thought content.    Assessment:   Pregnancy: G2P1001 Patient Active Problem List   Diagnosis Date Noted   Supervision of normal pregnancy 04/08/2023   Plan:  1. Encounter for supervision of normal pregnancy, antepartum, unspecified gravidity (Primary) Initial labs drawn. Flu shot recommended, pt declines Continue prenatal vitamins. Genetic Screening discussed: NIPS, carrier screening and AFP, ordered. FOB's brother has T21.  Ultrasound discussed; fetal anatomic survey: ordered. Problem list reviewed and updated. The nature of [facility] with multiple MDs and other Advanced Practice Providers was explained to patient; also emphasized that residents, students are part of our team. Reviewed BabyScripts optimized schedule with availability of sooner appointments prn if problems arise. She would like to try optimized schedule. Routine obstetric precautions reviewed. - US OB Limited; Future - Cytology - PAP( [place]) - Pregnancy, Initial Screen - Hemoglobin A1c - [place] MFM OB COMP + 14 WK; Future - Babyscripts Schedule Optimization - PANORAMA PRENATAL TEST - [place] Basic Panel  2. Asthma Rx for prn albuterol sent, she rarely uses an inhaler but notes symptoms worsen in spring with allergy season Discussed OTC allergy medications that  are safe in pregnancy   Return in about 4 weeks (around 05/11/2023) for return OB at 13-14 weeks.  Harvie Bridge, MD Obstetrician & Gynecologist, [facility] for [employer], [facility]

## 2023-04-14 ENCOUNTER — Encounter: Payer: Self-pay | Admitting: Obstetrics and Gynecology

## 2023-04-14 LAB — CYTOLOGY - PAP
Adequacy: ABSENT
Comment: NEGATIVE
Diagnosis: NEGATIVE
High risk HPV: NEGATIVE

## 2023-04-16 LAB — PREGNANCY, INITIAL SCREEN
Antibody Screen: NEGATIVE
Basophils Absolute: 0 10*3/uL (ref 0.0–0.2)
Basos: 1 %
Bilirubin, UA: NEGATIVE
Chlamydia trachomatis, NAA: NEGATIVE
EOS (ABSOLUTE): 0.1 10*3/uL (ref 0.0–0.4)
Eos: 1 %
Glucose, UA: NEGATIVE
HCV Ab: NONREACTIVE
HIV Screen 4th Generation wRfx: NONREACTIVE
Hematocrit: 30.5 % — ABNORMAL LOW (ref 34.0–46.6)
Hemoglobin: 8.2 g/dL — ABNORMAL LOW (ref 11.1–15.9)
Hepatitis B Surface Ag: NEGATIVE
Immature Grans (Abs): 0 10*3/uL (ref 0.0–0.1)
Immature Granulocytes: 0 %
Ketones, UA: NEGATIVE
Leukocytes,UA: NEGATIVE
Lymphocytes Absolute: 1.6 10*3/uL (ref 0.7–3.1)
Lymphs: 25 %
MCH: 16.8 pg — ABNORMAL LOW (ref 26.6–33.0)
MCHC: 26.9 g/dL — ABNORMAL LOW (ref 31.5–35.7)
MCV: 62 fL — ABNORMAL LOW (ref 79–97)
Monocytes Absolute: 0.5 10*3/uL (ref 0.1–0.9)
Monocytes: 7 %
Neisseria Gonorrhoeae by PCR: NEGATIVE
Neutrophils Absolute: 4.3 10*3/uL (ref 1.4–7.0)
Neutrophils: 66 %
Nitrite, UA: NEGATIVE
Platelets: 268 10*3/uL (ref 150–450)
Protein,UA: NEGATIVE
RBC, UA: NEGATIVE
RBC: 4.89 x10E6/uL (ref 3.77–5.28)
RDW: 26.4 % — ABNORMAL HIGH (ref 11.7–15.4)
RPR Ser Ql: NONREACTIVE
Rh Factor: POSITIVE
Rubella Antibodies, IGG: 8.87 {index} (ref >0.99)
Specific Gravity, UA: 1.021 (ref 1.005–1.030)
Urobilinogen, Ur: 0.2 mg/dL (ref 0.2–1.0)
WBC: 6.5 10*3/uL (ref 3.4–10.8)
pH, UA: 8 — ABNORMAL HIGH (ref 5.0–7.5)

## 2023-04-16 LAB — MICROSCOPIC EXAMINATION
Bacteria, UA: NONE SEEN
Casts: NONE SEEN /LPF
WBC, UA: NONE SEEN /HPF (ref 0–5)

## 2023-04-16 LAB — URINE CULTURE, OB REFLEX: Organism ID, Bacteria: NO GROWTH

## 2023-04-16 LAB — HEMOGLOBIN A1C
Est. average glucose Bld gHb Est-mCnc: 88 mg/dL
Hgb A1c MFr Bld: 4.7 % — ABNORMAL LOW (ref 4.8–5.6)

## 2023-04-16 LAB — HCV INTERPRETATION

## 2023-04-19 ENCOUNTER — Encounter: Payer: Self-pay | Admitting: Obstetrics and Gynecology

## 2023-04-19 DIAGNOSIS — D649 Anemia, unspecified: Secondary | ICD-10-CM | POA: Insufficient documentation

## 2023-04-19 MED ORDER — FERROUS SULFATE 325 (65 FE) MG PO TABS: 325.0000 mg | ORAL_TABLET | ORAL | 2 refills | Status: DC

## 2023-04-19 NOTE — Addendum Note (Signed)
 Addended by: Harvie Bridge on: 04/19/2023 01:37 AM   Modules accepted: Orders

## 2023-05-05 ENCOUNTER — Telehealth: Payer: Self-pay

## 2023-05-05 NOTE — Telephone Encounter (Signed)
 Patient states she gets nauseated with prenatal. She states that she is taking it with food. Please advise.

## 2023-05-07 DIAGNOSIS — Z419 Encounter for procedure for purposes other than remedying health state, unspecified: Secondary | ICD-10-CM | POA: Diagnosis not present

## 2023-05-18 ENCOUNTER — Ambulatory Visit (INDEPENDENT_AMBULATORY_CARE_PROVIDER_SITE_OTHER): Admitting: Obstetrics and Gynecology

## 2023-05-18 VITALS — BP 114/75 | HR 72 | Wt 135.0 lb

## 2023-05-18 DIAGNOSIS — J4520 Mild intermittent asthma, uncomplicated: Secondary | ICD-10-CM | POA: Diagnosis not present

## 2023-05-18 DIAGNOSIS — D649 Anemia, unspecified: Secondary | ICD-10-CM

## 2023-05-18 DIAGNOSIS — Z3A15 15 weeks gestation of pregnancy: Secondary | ICD-10-CM

## 2023-05-18 DIAGNOSIS — Z3490 Encounter for supervision of normal pregnancy, unspecified, unspecified trimester: Secondary | ICD-10-CM | POA: Diagnosis not present

## 2023-05-18 MED ORDER — PREPLUS 27-1 MG PO TABS: 1.0000 | ORAL_TABLET | Freq: Every day | ORAL | 3 refills | Status: AC

## 2023-05-18 NOTE — Progress Notes (Signed)
   PRENATAL VISIT NOTE  Subjective:  Lindsey Harvey is a 33 y.o. G2P1001 at [redacted]w[redacted]d being seen today for ongoing prenatal care.  She is currently monitored for the following issues for this low-risk pregnancy and has Supervision of normal pregnancy and Anemia on their problem list.  Patient reports  back pain, intermittent, better with heating pad and tylenol . Notes lower energy and needing to take naps. Requests new PNV rx, has nature made but the smell makes her nauseated .  Contractions: Not present. Vag. Bleeding: None.  Movement: Absent. Denies leaking of fluid.   The following portions of the patient's history were reviewed and updated as appropriate: allergies, current medications, past family history, past medical history, past social history, past surgical history and problem list.   Objective:   Vitals:   05/18/23 1012  BP: 114/75  Pulse: 72  Weight: 135 lb (61.2 kg)    Fetal Status: Fetal Heart Rate (bpm): 140   Movement: Absent     General:  Alert, oriented and cooperative. Patient is in no acute distress.  Skin: Skin is warm and dry. No rash noted.   Cardiovascular: Normal heart rate noted  Respiratory: Normal respiratory effort, no problems with respiration noted  Abdomen: Soft, gravid, appropriate for gestational age.  Pain/Pressure: Absent      Assessment and Plan:  Pregnancy: G2P1001 at [redacted]w[redacted]d 1. [redacted] weeks gestation of pregnancy (Primary) 2. Encounter for supervision of normal pregnancy, antepartum, unspecified gravidity Discussed management of back pain  New PNV rx sent  AFP reviewed, ordered Will f/u fatigue next visit, if persistent can check TSH +/- vit D. Suspect this may be pregnancy & anemia related Anatomy US  scheduled 5/28 - AFP, Serum, Open Spina Bifida  3. Anemia, unspecified type NOB Hgb 8.2 On PO iron Will recheck labs w/ iron studies, may need IV iron - CBC - Ferritin - Iron Binding Cap (TIBC)([facility]/Sun[facility]) - B12 - Folate  4. Mild intermittent  asthma without complication Well controlled, no complaints even with allergy season  Please refer to After Visit Summary for other counseling recommendations.   Return in about 5 weeks (around 06/22/2023) for return OB at 20 weeks - BRx optimized, MD or APP.  Future Appointments  Date Time Provider Department Center  06/22/2023  8:15 AM [facility] NURSE [facility]  06/22/2023  8:30 AM [facility] US5 [facility]  06/22/2023 10:50 AM Izell Marsh, MD [facility]   Izell Marsh, MD

## 2023-05-19 ENCOUNTER — Encounter: Payer: Self-pay | Admitting: Obstetrics and Gynecology

## 2023-05-19 LAB — IRON AND TIBC
Iron Saturation: 20 % (ref 15–55)
Iron: 80 ug/dL (ref 27–159)
Total Iron Binding Capacity: 393 ug/dL (ref 250–450)
UIBC: 313 ug/dL (ref 131–425)

## 2023-05-19 LAB — CBC
Hematocrit: 37.3 % (ref 34.0–46.6)
Hemoglobin: 11.3 g/dL (ref 11.1–15.9)
MCH: 22 pg — ABNORMAL LOW (ref 26.6–33.0)
MCHC: 30.3 g/dL — ABNORMAL LOW (ref 31.5–35.7)
MCV: 73 fL — ABNORMAL LOW (ref 79–97)
Platelets: 268 10*3/uL (ref 150–450)
RBC: 5.14 x10E6/uL (ref 3.77–5.28)
RDW: 29.7 % — ABNORMAL HIGH (ref 11.7–15.4)
WBC: 8.1 10*3/uL (ref 3.4–10.8)

## 2023-05-19 LAB — VITAMIN B12: Vitamin B-12: 435 pg/mL (ref 232–1245)

## 2023-05-19 LAB — FERRITIN: Ferritin: 38 ng/mL (ref 15–150)

## 2023-05-19 LAB — FOLATE: Folate: 17.8 ng/mL (ref >3.0)

## 2023-05-20 ENCOUNTER — Encounter: Payer: Self-pay | Admitting: Obstetrics and Gynecology

## 2023-05-20 LAB — AFP, SERUM, OPEN SPINA BIFIDA
AFP MoM: 0.58
AFP Value: 18.1 ng/mL
Gest. Age on Collection Date: 15 wk
Maternal Age At EDD: 31.6 a
OSBR Risk 1 IN: 10000
Test Results:: NEGATIVE
Weight: 135 [lb_av]

## 2023-06-06 DIAGNOSIS — Z419 Encounter for procedure for purposes other than remedying health state, unspecified: Secondary | ICD-10-CM | POA: Diagnosis not present

## 2023-06-22 ENCOUNTER — Ambulatory Visit

## 2023-06-22 ENCOUNTER — Other Ambulatory Visit: Payer: Self-pay | Admitting: Obstetrics and Gynecology

## 2023-06-22 ENCOUNTER — Other Ambulatory Visit: Payer: Self-pay | Admitting: *Deleted

## 2023-06-22 ENCOUNTER — Ambulatory Visit (HOSPITAL_BASED_OUTPATIENT_CLINIC_OR_DEPARTMENT_OTHER): Admitting: Obstetrics

## 2023-06-22 ENCOUNTER — Ambulatory Visit: Attending: Obstetrics and Gynecology

## 2023-06-22 ENCOUNTER — Ambulatory Visit: Admitting: Obstetrics and Gynecology

## 2023-06-22 VITALS — BP 117/76 | HR 77 | Wt 140.0 lb

## 2023-06-22 VITALS — BP 117/76 | HR 77

## 2023-06-22 DIAGNOSIS — D649 Anemia, unspecified: Secondary | ICD-10-CM

## 2023-06-22 DIAGNOSIS — D259 Leiomyoma of uterus, unspecified: Secondary | ICD-10-CM

## 2023-06-22 DIAGNOSIS — Z3689 Encounter for other specified antenatal screening: Secondary | ICD-10-CM | POA: Insufficient documentation

## 2023-06-22 DIAGNOSIS — O358XX0 Maternal care for other (suspected) fetal abnormality and damage, not applicable or unspecified: Secondary | ICD-10-CM | POA: Insufficient documentation

## 2023-06-22 DIAGNOSIS — O321XX0 Maternal care for breech presentation, not applicable or unspecified: Secondary | ICD-10-CM | POA: Insufficient documentation

## 2023-06-22 DIAGNOSIS — Z3A20 20 weeks gestation of pregnancy: Secondary | ICD-10-CM | POA: Diagnosis not present

## 2023-06-22 DIAGNOSIS — Z3482 Encounter for supervision of other normal pregnancy, second trimester: Secondary | ICD-10-CM | POA: Insufficient documentation

## 2023-06-22 DIAGNOSIS — Z3490 Encounter for supervision of normal pregnancy, unspecified, unspecified trimester: Secondary | ICD-10-CM

## 2023-06-22 DIAGNOSIS — O3412 Maternal care for benign tumor of corpus uteri, second trimester: Secondary | ICD-10-CM | POA: Diagnosis present

## 2023-06-22 DIAGNOSIS — J4520 Mild intermittent asthma, uncomplicated: Secondary | ICD-10-CM

## 2023-06-22 DIAGNOSIS — Z3A10 10 weeks gestation of pregnancy: Secondary | ICD-10-CM

## 2023-06-22 DIAGNOSIS — Z3481 Encounter for supervision of other normal pregnancy, first trimester: Secondary | ICD-10-CM

## 2023-06-22 NOTE — Patient Instructions (Signed)
 Please do not eat or drink anything besides water before your next appointment.

## 2023-06-22 NOTE — Progress Notes (Signed)
   PRENATAL VISIT NOTE  Subjective:  Lindsey Harvey is a 33 y.o. G2P1001 at [redacted]w[redacted]d being seen today for ongoing prenatal care.  She is currently monitored for the following issues for this low-risk pregnancy and has Supervision of normal pregnancy and Anemia on their problem list.  Patient reports no complaints.  Contractions: Not present. Vag. Bleeding: None.  Movement: Present. Denies leaking of fluid.   The following portions of the patient's history were reviewed and updated as appropriate: allergies, current medications, past family history, past medical history, past social history, past surgical history and problem list.   Objective:   Vitals:   06/22/23 1038  BP: 117/76  Pulse: 77  Weight: 140 lb (63.5 kg)    Fetal Status: Fetal Heart Rate (bpm): 142   Movement: Present       General:  Alert, oriented and cooperative. Patient is in no acute distress.  Skin: Skin is warm and dry. No rash noted.   Cardiovascular: Normal heart rate noted  Respiratory: Normal respiratory effort, no problems with respiration noted  Abdomen: Soft, gravid, appropriate for gestational age.  Pain/Pressure: Absent      Assessment and Plan:  Pregnancy: G2P1001 at [redacted]w[redacted]d 1. Encounter for supervision of other normal pregnancy in second trimester (Primary) 2. [redacted] weeks gestation of pregnancy Declines AFP today Anatomy US  today - report not available, pt reports fibroids and follow up growth planned in 4 weeks Doing well with babyscripts Discussed fasting prior to next appt for 2h GTT, CBC, HIV, RPR and tdap  3. Anemia, unspecified type Hgb 8.2 > 11.3 on 4/23 Continue po iron, will f/u next visit  Please refer to After Visit Summary for other counseling recommendations.   Return in about 8 weeks (around 08/17/2023) for return OB at 28 weeks with 2h GTT, CBC, HIV, RPR and tdap (babyscripts optimized).  Future Appointments  Date Time Provider Department Center  07/20/2023 11:00 AM [facility] PROVIDER 1  [facility]  07/20/2023 11:30 AM [facility] US1 [facility]    Izell Marsh, MD

## 2023-06-22 NOTE — Progress Notes (Signed)
 MFM Consult Note  Lindsey Harvey is currently at 20 weeks and 3 days.  She was seen for a detailed fetal anatomy scan due to a fibroid uterus.    The patient's [facility] of November 06, 2023 was confirmed via a first trimester ultrasound performed in your office.  A possible vanishing twin was also noted during that first trimester ultrasound.  She denies any significant past medical history and denies any problems in her current pregnancy.    She has not had a screening test for fetal aneuploidy drawn in her current pregnancy.  On today's exam, the overall EFW measures about 10 days behind her dates.  There was normal amniotic fluid noted.  There were no obvious fetal anomalies noted on today's ultrasound exam.  However, today's exam was limited due to the fetal position.  The patient was informed that anomalies may be missed due to technical limitations. If the fetus is in a suboptimal position or maternal habitus is increased, visualization of the fetus in the maternal uterus may be impaired.  A large 7 to 8 cm fibroid was noted in the left lateral side of the lower uterine segment.  This large fibroid does not appear to obstruct the opening of her cervix.  The patient did have a successful vaginal delivery about 6 years ago.  The increased risk of maternal pain issues and fetal growth issues later in her pregnancy due to the fibroids was discussed.    Due to her fibroid uterus, we will continue to follow her with growth ultrasounds throughout her pregnancy.    As the patient has not had any screening tests for fetal aneuploidy drawn, she had the Panorama cell free DNA test and the Horizon genetic carrier screening test drawn following her ultrasound exam today.  Our genetic counselor will notify her regarding the results of this test.  A follow-up exam was scheduled in 4 weeks to assess the fetal growth and to complete the views of the fetal anatomy.  The patient stated that all of her questions  were answered today.  A total of 45 minutes was spent counseling and coordinating the care for this patient.  Greater than 50% of the time was spent in direct face-to-face contact.

## 2023-06-23 ENCOUNTER — Encounter: Payer: Self-pay | Admitting: Obstetrics and Gynecology

## 2023-06-23 DIAGNOSIS — D219 Benign neoplasm of connective and other soft tissue, unspecified: Secondary | ICD-10-CM | POA: Insufficient documentation

## 2023-06-28 ENCOUNTER — Telehealth: Payer: Self-pay

## 2023-06-28 NOTE — Telephone Encounter (Signed)
 I spoke with the patient to return her recent Panorama noninvasive prenatal screening (NIPS) results. The result is low risk, consistent with a female fetus. This screening significantly reduces but does not eliminate the chance that the current pregnancy has Down syndrome (trisomy 29), trisomy 48, trisomy 77, and common sex chromosome conditions. Please see report for details. There are many genetic conditions that cannot be detected by NIPS.  Lindsey Kindle, MS, [facility] Certified Genetic Counselor [facility] for [facility]aternal Fetal [facility] [phone]

## 2023-07-01 ENCOUNTER — Other Ambulatory Visit: Payer: Self-pay

## 2023-07-07 DIAGNOSIS — Z419 Encounter for procedure for purposes other than remedying health state, unspecified: Secondary | ICD-10-CM | POA: Diagnosis not present

## 2023-07-20 ENCOUNTER — Ambulatory Visit: Attending: Obstetrics | Admitting: Obstetrics and Gynecology

## 2023-07-20 ENCOUNTER — Other Ambulatory Visit: Payer: Self-pay | Admitting: Obstetrics

## 2023-07-20 ENCOUNTER — Ambulatory Visit

## 2023-07-20 ENCOUNTER — Other Ambulatory Visit: Payer: Self-pay | Admitting: *Deleted

## 2023-07-20 VITALS — BP 106/74 | HR 82

## 2023-07-20 DIAGNOSIS — D259 Leiomyoma of uterus, unspecified: Secondary | ICD-10-CM

## 2023-07-20 DIAGNOSIS — O322XX0 Maternal care for transverse and oblique lie, not applicable or unspecified: Secondary | ICD-10-CM | POA: Diagnosis not present

## 2023-07-20 DIAGNOSIS — O365920 Maternal care for other known or suspected poor fetal growth, second trimester, not applicable or unspecified: Secondary | ICD-10-CM | POA: Insufficient documentation

## 2023-07-20 DIAGNOSIS — O358XX0 Maternal care for other (suspected) fetal abnormality and damage, not applicable or unspecified: Secondary | ICD-10-CM | POA: Diagnosis not present

## 2023-07-20 DIAGNOSIS — O3412 Maternal care for benign tumor of corpus uteri, second trimester: Secondary | ICD-10-CM | POA: Diagnosis not present

## 2023-07-20 DIAGNOSIS — Z363 Encounter for antenatal screening for malformations: Secondary | ICD-10-CM | POA: Insufficient documentation

## 2023-07-20 DIAGNOSIS — Z3A24 24 weeks gestation of pregnancy: Secondary | ICD-10-CM | POA: Diagnosis not present

## 2023-07-20 DIAGNOSIS — D219 Benign neoplasm of connective and other soft tissue, unspecified: Secondary | ICD-10-CM

## 2023-07-20 DIAGNOSIS — D251 Intramural leiomyoma of uterus: Secondary | ICD-10-CM | POA: Diagnosis not present

## 2023-07-20 DIAGNOSIS — Z3482 Encounter for supervision of other normal pregnancy, second trimester: Secondary | ICD-10-CM

## 2023-07-20 NOTE — Progress Notes (Signed)
 Maternal-Fetal Medicine Consultation Name: Lindsey Harvey MRN: [id number]  G2 P1001 at 24w 3d gestation.  Her EDD is established by sure LMP date.  Ultrasound performed at [redacted] weeks gestation showed 6 days difference in EDD from her sure LMP date. Patient checked And showed that she has been getting regular periods in October, November, December and in January.  Her LMP in January was 01/30/2023.  Obstetric history is significant for a term vaginal delivery of a female infant weighing 7 pounds and 10 ounces at birth.  Her blood pressure today at our office is 106/74 mmHg.  She does not have any chronic medical conditions.  Ultrasound On today's ultrasound, the estimated fetal weight is at the 7th percentile and the abdominal circumference measurement is at the 9th percentile.  Head circumference measurement is at between -2 and -1 SD (normal).  Amniotic fluid is normal good fetal activity seen. Umbilical artery Doppler showed normal forward diastolic flow. 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  Patient does not have symptoms pertaining to the myoma.  Fetal growth restriction Since the patient is very sure of her LMP and has been recording all her dates, I have not amended her EDD, which is 11/06/2023..  I explained the finding of fetal growth restriction that is difficult to differentiate from a constitutionally small for gestational age fetus.   I discussed the possible causes of fetal growth restriction including placental insufficiency (most common cause), chromosomal anomalies and fetal infections.  Patient did not give history of fever or rashes.  I explained that only amniocentesis will give a definitive result on the fetal karyotype and some genetic conditions (Microarray).  I explained amniocentesis procedure and possible complication of preterm delivery (1 and 500 procedures). Patient opted not to have amniocentesis.   I discussed  ultrasound protocol of monitoring fetal growth restriction.  Timing of delivery: Since small for gestational age fetuses have a higher chance of having perinatal mortality and morbidity, delivery at 25 to [redacted] weeks gestation is reasonable.  If, however, severe fetal growth restriction is seen with normal antenatal testing, we will recommend delivery at [redacted] weeks gestation.   Recommendations - An appointment was made for her to return in 3 weeks for fetal growth assessment and umbilical artery Doppler study.   Consultation including face-to-face (more than 50%) counseling 20 minutes.

## 2023-08-06 DIAGNOSIS — Z419 Encounter for procedure for purposes other than remedying health state, unspecified: Secondary | ICD-10-CM | POA: Diagnosis not present

## 2023-08-11 ENCOUNTER — Ambulatory Visit: Attending: Obstetrics and Gynecology

## 2023-08-11 ENCOUNTER — Ambulatory Visit (HOSPITAL_BASED_OUTPATIENT_CLINIC_OR_DEPARTMENT_OTHER): Admitting: Obstetrics

## 2023-08-11 VITALS — BP 116/70 | HR 94

## 2023-08-11 DIAGNOSIS — D259 Leiomyoma of uterus, unspecified: Secondary | ICD-10-CM

## 2023-08-11 DIAGNOSIS — O43122 Velamentous insertion of umbilical cord, second trimester: Secondary | ICD-10-CM | POA: Insufficient documentation

## 2023-08-11 DIAGNOSIS — D219 Benign neoplasm of connective and other soft tissue, unspecified: Secondary | ICD-10-CM | POA: Insufficient documentation

## 2023-08-11 DIAGNOSIS — O365920 Maternal care for other known or suspected poor fetal growth, second trimester, not applicable or unspecified: Secondary | ICD-10-CM | POA: Diagnosis not present

## 2023-08-11 DIAGNOSIS — O3412 Maternal care for benign tumor of corpus uteri, second trimester: Secondary | ICD-10-CM | POA: Insufficient documentation

## 2023-08-11 DIAGNOSIS — O321XX0 Maternal care for breech presentation, not applicable or unspecified: Secondary | ICD-10-CM | POA: Insufficient documentation

## 2023-08-11 DIAGNOSIS — Z3A27 27 weeks gestation of pregnancy: Secondary | ICD-10-CM | POA: Diagnosis not present

## 2023-08-11 NOTE — Progress Notes (Signed)
 MFM Consult Note  Lindsey Harvey is currently at 27 weeks and 4 days.  She has been followed due to IUGR and a fibroid uterus.    She denies any problems since her last exam and reports feeling vigorous fetal movements throughout the day.    On today's exam, the EFW of 2 pounds 1 ounce measures at the 7th percentile for her gestational age indicating IUGR.   The total AFI was 21.85 cm (within normal limits).  Fetal movements were noted throughout today's exam.  Doppler studies of the umbilical arteries showed an elevated S/D ratio of 4.37 .  There were no signs of absent or reversed end-diastolic flow.    The anterior placenta appears globular in appearance.  She was advised that perhaps placental dysfunction due to the fibroid uterus may be the cause of IUGR.  A large 11 cm lower uterine segment fibroid continues to be noted on today's exam.    I do not believe that the fibroid is obstructing her cervix.  The patient was advised that we will perform a transvaginal ultrasound later in her pregnancy to assess if she can attempt a vaginal delivery.  The patient was advised that IUGR is a common finding.  Most cases of IUGR result in the delivery of a healthy infant at or close to term.  Due to IUGR, we will continue to follow her with weekly fetal testing and umbilical artery Doppler studies.    We will reassess the fetal growth again in 3 weeks.    She will return in 1 week for an NST and umbilical artery Doppler study.    The patient stated that all of her questions were answered today.  A total of 20 minutes was spent counseling and coordinating the care for this patient.  Greater than 50% of the time was spent in direct face-to-face contact.

## 2023-08-12 ENCOUNTER — Other Ambulatory Visit: Payer: Self-pay | Admitting: *Deleted

## 2023-08-12 DIAGNOSIS — D219 Benign neoplasm of connective and other soft tissue, unspecified: Secondary | ICD-10-CM

## 2023-08-12 DIAGNOSIS — O365920 Maternal care for other known or suspected poor fetal growth, second trimester, not applicable or unspecified: Secondary | ICD-10-CM

## 2023-08-17 ENCOUNTER — Ambulatory Visit (INDEPENDENT_AMBULATORY_CARE_PROVIDER_SITE_OTHER): Admitting: Obstetrics and Gynecology

## 2023-08-17 VITALS — BP 102/68 | HR 87 | Wt 147.0 lb

## 2023-08-17 DIAGNOSIS — Z3A28 28 weeks gestation of pregnancy: Secondary | ICD-10-CM

## 2023-08-17 DIAGNOSIS — Z3481 Encounter for supervision of other normal pregnancy, first trimester: Secondary | ICD-10-CM

## 2023-08-17 DIAGNOSIS — D509 Iron deficiency anemia, unspecified: Secondary | ICD-10-CM

## 2023-08-17 DIAGNOSIS — O365990 Maternal care for other known or suspected poor fetal growth, unspecified trimester, not applicable or unspecified: Secondary | ICD-10-CM | POA: Diagnosis not present

## 2023-08-17 DIAGNOSIS — D219 Benign neoplasm of connective and other soft tissue, unspecified: Secondary | ICD-10-CM | POA: Diagnosis not present

## 2023-08-17 NOTE — Progress Notes (Signed)
   PRENATAL VISIT NOTE  Subjective:  Lindsey Harvey is a 33 y.o. G2P1001 at [redacted]w[redacted]d being seen today for ongoing prenatal care.  She is currently monitored for the following issues for this high-risk pregnancy and has Supervision of normal pregnancy; Anemia; and Fibroid on their problem list.  Patient reports no complaints.  Contractions: Not present. Vag. Bleeding: None.  Movement: Present. Denies leaking of fluid.   The following portions of the patient's history were reviewed and updated as appropriate: allergies, current medications, past family history, past medical history, past social history, past surgical history and problem list.   Objective:   Vitals:   08/17/23 0836  BP: 102/68  Pulse: 87  Weight: 147 lb (66.7 kg)    Fetal Status:     Movement: Present     General:  Alert, oriented and cooperative. Patient is in no acute distress.  Skin: Skin is warm and dry. No rash noted.   Cardiovascular: Normal heart rate noted  Respiratory: Normal respiratory effort, no problems with respiration noted  Abdomen: Soft, gravid, appropriate for gestational age.  Pain/Pressure: Absent      Assessment and Plan:  Pregnancy: G2P1001 at [redacted]w[redacted]d 1. Encounter for supervision of other normal pregnancy in first trimester (Primary) 2. [redacted] weeks gestation of pregnancy Discussed tdap, she would like to think about it and discuss further next appt Will come off babyscripts optimized schedule due to FGR diagnosis - Glucose Tolerance, 2 Hours w/1 Hour - CBC - RPR - HIV Antibody (routine testing w rflx)  3. Fetal growth restriction antepartum 3.8%ile at anatomy - f/u growth on 7/17 @ 27/4 924g (7%), AC 18%, R lateral, AFI 21.85, elevated UAD Weekly dopplers with MFM, next scheduled tomorrow Anticipate delivery by 37 weeks  4. Fibroid LUS IM 8.3 x 4.9 x 7.1cm at anatomy > 11.52 x 8.05 x 8.36 at 27 weeks  5. Iron deficiency anemia, unspecified iron deficiency anemia type PO iron CBC today  Please  refer to After Visit Summary for other counseling recommendations.   Return in about 2 weeks (around 08/31/2023) for return OB at 30 weeks.  Future Appointments  Date Time Provider Department Center  08/18/2023  1:15 PM [facility] PROVIDER 1 [facility]  08/18/2023  1:30 PM [facility] US5 [facility]  08/18/2023  2:15 PM [facility] NST [facility]  08/25/2023  1:15 PM [facility] PROVIDER 1 [facility]  08/25/2023  1:15 PM [facility] NST [facility]  08/25/2023  1:30 PM [facility] US2 [facility]  08/31/2023  1:50 PM [name], MD [facility]  09/01/2023  2:00 PM [facility] PROVIDER 1 [facility]  09/01/2023  2:15 PM [facility] US7 [facility]  09/01/2023  3:15 PM [facility] NST [facility]  09/08/2023  2:30 PM [facility] PROVIDER 1 [facility]  09/08/2023  3:00 PM [facility] US5 [facility]  09/08/2023  3:15 PM [facility] NST [facility]  09/14/2023  2:30 PM [name], MD [facility]  09/15/2023  1:15 PM [facility] PROVIDER 1 [facility]  09/15/2023  1:30 PM [facility] US4 [facility]  09/22/2023  1:00 PM [facility] PROVIDER 1 [facility]  09/22/2023  1:30 PM [facility] US1 [facility]   Kieth BROCKS [name], MD

## 2023-08-18 ENCOUNTER — Ambulatory Visit: Attending: Obstetrics and Gynecology | Admitting: Obstetrics and Gynecology

## 2023-08-18 ENCOUNTER — Ambulatory Visit (HOSPITAL_BASED_OUTPATIENT_CLINIC_OR_DEPARTMENT_OTHER)

## 2023-08-18 ENCOUNTER — Ambulatory Visit: Payer: Self-pay | Admitting: Obstetrics and Gynecology

## 2023-08-18 ENCOUNTER — Ambulatory Visit

## 2023-08-18 VITALS — BP 110/70 | HR 84

## 2023-08-18 DIAGNOSIS — D219 Benign neoplasm of connective and other soft tissue, unspecified: Secondary | ICD-10-CM

## 2023-08-18 DIAGNOSIS — O365930 Maternal care for other known or suspected poor fetal growth, third trimester, not applicable or unspecified: Secondary | ICD-10-CM

## 2023-08-18 DIAGNOSIS — O365920 Maternal care for other known or suspected poor fetal growth, second trimester, not applicable or unspecified: Secondary | ICD-10-CM | POA: Diagnosis present

## 2023-08-18 DIAGNOSIS — Z3A28 28 weeks gestation of pregnancy: Secondary | ICD-10-CM

## 2023-08-18 DIAGNOSIS — D259 Leiomyoma of uterus, unspecified: Secondary | ICD-10-CM | POA: Insufficient documentation

## 2023-08-18 DIAGNOSIS — O43123 Velamentous insertion of umbilical cord, third trimester: Secondary | ICD-10-CM | POA: Insufficient documentation

## 2023-08-18 DIAGNOSIS — D509 Iron deficiency anemia, unspecified: Secondary | ICD-10-CM

## 2023-08-18 DIAGNOSIS — Z3483 Encounter for supervision of other normal pregnancy, third trimester: Secondary | ICD-10-CM

## 2023-08-18 DIAGNOSIS — O3413 Maternal care for benign tumor of corpus uteri, third trimester: Secondary | ICD-10-CM | POA: Insufficient documentation

## 2023-08-18 DIAGNOSIS — Z362 Encounter for other antenatal screening follow-up: Secondary | ICD-10-CM | POA: Diagnosis not present

## 2023-08-18 LAB — CBC
Hematocrit: 41.2 % (ref 34.0–46.6)
Hemoglobin: 12.9 g/dL (ref 11.1–15.9)
MCH: 26.5 pg — ABNORMAL LOW (ref 26.6–33.0)
MCHC: 31.3 g/dL — ABNORMAL LOW (ref 31.5–35.7)
MCV: 85 fL (ref 79–97)
Platelets: 185 x10E3/uL (ref 150–450)
RBC: 4.86 x10E6/uL (ref 3.77–5.28)
RDW: 16 % — ABNORMAL HIGH (ref 11.7–15.4)
WBC: 5.3 x10E3/uL (ref 3.4–10.8)

## 2023-08-18 LAB — GLUCOSE TOLERANCE, 2 HOURS W/ 1HR
Glucose, 1 hour: 160 mg/dL (ref 70–179)
Glucose, 2 hour: 95 mg/dL (ref 70–152)
Glucose, Fasting: 83 mg/dL (ref 70–91)

## 2023-08-18 LAB — RPR: RPR Ser Ql: NONREACTIVE

## 2023-08-18 LAB — HIV ANTIBODY (ROUTINE TESTING W REFLEX): HIV Screen 4th Generation wRfx: NONREACTIVE

## 2023-08-18 NOTE — Progress Notes (Signed)
 After review, MFM consult with provider is not indicated for today  Arna Ranks, MD 08/18/2023 3:01 PM  [facility]

## 2023-08-22 DIAGNOSIS — O365930 Maternal care for other known or suspected poor fetal growth, third trimester, not applicable or unspecified: Secondary | ICD-10-CM | POA: Insufficient documentation

## 2023-08-22 DIAGNOSIS — O365990 Maternal care for other known or suspected poor fetal growth, unspecified trimester, not applicable or unspecified: Secondary | ICD-10-CM | POA: Insufficient documentation

## 2023-08-25 ENCOUNTER — Ambulatory Visit: Admitting: Maternal & Fetal Medicine

## 2023-08-25 ENCOUNTER — Ambulatory Visit: Attending: Obstetrics and Gynecology

## 2023-08-25 ENCOUNTER — Ambulatory Visit

## 2023-08-25 VITALS — BP 121/74 | HR 93

## 2023-08-25 DIAGNOSIS — O43123 Velamentous insertion of umbilical cord, third trimester: Secondary | ICD-10-CM | POA: Insufficient documentation

## 2023-08-25 DIAGNOSIS — Z3A29 29 weeks gestation of pregnancy: Secondary | ICD-10-CM | POA: Diagnosis not present

## 2023-08-25 DIAGNOSIS — O3413 Maternal care for benign tumor of corpus uteri, third trimester: Secondary | ICD-10-CM

## 2023-08-25 DIAGNOSIS — O43129 Velamentous insertion of umbilical cord, unspecified trimester: Secondary | ICD-10-CM | POA: Insufficient documentation

## 2023-08-25 DIAGNOSIS — O43122 Velamentous insertion of umbilical cord, second trimester: Secondary | ICD-10-CM | POA: Insufficient documentation

## 2023-08-25 DIAGNOSIS — O365930 Maternal care for other known or suspected poor fetal growth, third trimester, not applicable or unspecified: Secondary | ICD-10-CM

## 2023-08-25 DIAGNOSIS — D219 Benign neoplasm of connective and other soft tissue, unspecified: Secondary | ICD-10-CM | POA: Insufficient documentation

## 2023-08-25 DIAGNOSIS — O365920 Maternal care for other known or suspected poor fetal growth, second trimester, not applicable or unspecified: Secondary | ICD-10-CM | POA: Diagnosis not present

## 2023-08-25 DIAGNOSIS — D259 Leiomyoma of uterus, unspecified: Secondary | ICD-10-CM

## 2023-08-25 NOTE — Progress Notes (Signed)
   Patient information  Patient Name: Lindsey Harvey  Patient MRN:   [id number]  Referring practice: MFM Referring Provider: [facility])  Problem List   Patient Active Problem List   Diagnosis Date Noted   IUGR (intrauterine growth restriction) affecting care of mother 08/22/2023   Fibroid 06/23/2023   Anemia - resolved with PO iron 04/19/2023   Supervision of normal pregnancy 04/08/2023   Maternal Fetal medicine Consult  Lindsey Harvey is a 33 y.o. G2P1001 at [redacted]w[redacted]d here for ultrasound and consultation. Lindsey Harvey is doing well today with no acute concerns. Today we focused on the following:   FGR: The prior EFW was at the 7th percentile overall and 18th percentile. Could be due to a velamentous cord insertion.   Uterine fibroid: located in the lower left uterine segment. 8.9 x 6.1 x 7.7 cm.   Sonographic findings Single intrauterine pregnancy at 29w 4d. Fetal cardiac activity:  Observed and appears normal. Presentation: Breech. Interval fetal anatomy appears normal. Amniotic fluid: Within normal limits.  MVP: 4.37 cm. Placenta: Anterior. Umbilical artery dopplers findings: -S/D:3.58 which are normal at this gestational age.  -Absent end-diastolic flow: No.  -Reversed end-diastolic flow:  No. NST was reassuring for gestational age.   The patient had time to ask questions that were answered to her satisfaction.  She verbalized understanding and agrees to proceed with the plan below.  Recommendations - Continue UA dopplers and antenatal testing as scheduled. - Serial growth US  every 3 weeks until delivery. - Delivery likely around 37-38 weeks pending clinical course.   Review of Systems: A review of systems was performed and was negative except per HPI   Vitals and Physical Exam    08/25/2023    1:14 PM 08/18/2023    1:18 PM 08/17/2023    8:36 AM  Vitals with BMI  Weight   147 lbs  Systolic 121 110 897  Diastolic 74 70 68  Pulse 93 84 87    Sitting  comfortably on the sonogram table Nonlabored breathing Normal rate and rhythm Abdomen is nontender  Past pregnancies OB History  Gravida Para Term Preterm AB Living  2 1 1  0 0 1  SAB IAB Ectopic Multiple Live Births  0 0 0 0 1    # Outcome Date GA Lbr Len/2nd Weight Sex Type Anes PTL Lv  2 Current           1 Term 07/11/17 [redacted]w[redacted]d 11:31 / 01:47 7 lb 10.4 oz (3.47 kg) F Vag-Spont EPI  LIV     I spent 20 minutes reviewing the patients chart, including labs and images as well as counseling the patient about her medical conditions. Greater than 50% of the time was spent in direct face-to-face patient counseling.  [name]  MFM, [facility]   08/25/2023  3:05 PM

## 2023-08-25 NOTE — Procedures (Signed)
 Lindsey Harvey 08/11/1990 [redacted]w[redacted]d  Fetus A Non-Stress Test Interpretation for 08/25/23  Indication: IUGR  Fetal Heart Rate A Mode: External Baseline Rate (A): 135 bpm Variability: Moderate Accelerations: 10 x 10 Decelerations: None Multiple birth?: No  Uterine Activity Mode: Palpation, Toco Contraction Frequency (min): none noted Resting Tone Palpated: Relaxed  Interpretation (Fetal Testing) Nonstress Test Interpretation: Reactive Comments: Pt to ultrasound, reviewed with Dr. William

## 2023-08-31 ENCOUNTER — Ambulatory Visit (INDEPENDENT_AMBULATORY_CARE_PROVIDER_SITE_OTHER): Admitting: Obstetrics and Gynecology

## 2023-08-31 VITALS — BP 109/75 | HR 98 | Wt 149.1 lb

## 2023-08-31 DIAGNOSIS — Z3A30 30 weeks gestation of pregnancy: Secondary | ICD-10-CM

## 2023-08-31 DIAGNOSIS — O365930 Maternal care for other known or suspected poor fetal growth, third trimester, not applicable or unspecified: Secondary | ICD-10-CM

## 2023-08-31 DIAGNOSIS — Z3483 Encounter for supervision of other normal pregnancy, third trimester: Secondary | ICD-10-CM

## 2023-08-31 DIAGNOSIS — D219 Benign neoplasm of connective and other soft tissue, unspecified: Secondary | ICD-10-CM

## 2023-08-31 NOTE — Progress Notes (Signed)
   PRENATAL VISIT NOTE  Subjective:  Lindsey Harvey is a 33 y.o. G2P1001 at [redacted]w[redacted]d being seen today for ongoing prenatal care.  She is currently monitored for the following issues for this low-risk pregnancy and has Supervision of normal pregnancy; Anemia - resolved with PO iron; Fibroid; IUGR (intrauterine growth restriction) affecting care of mother; and Velamentous insertion of umbilical cord on their problem list.  Patient reports no complaints.  Contractions: Not present. Vag. Bleeding: None.  Movement: Present. Denies leaking of fluid.   The following portions of the patient's history were reviewed and updated as appropriate: allergies, current medications, past family history, past medical history, past social history, past surgical history and problem list.   Objective:    Vitals:   08/31/23 1359  BP: 109/75  Pulse: 98  Weight: 67.6 kg    Fetal Status:  Fetal Heart Rate (bpm): 135   Movement: Present    General: Alert, oriented and cooperative. Patient is in no acute distress.  Skin: Skin is warm and dry. No rash noted.   Cardiovascular: Normal heart rate noted  Respiratory: Normal respiratory effort, no problems with respiration noted  Abdomen: Soft, gravid, appropriate for gestational age.  Pain/Pressure: Absent     Pelvic: Cervical exam deferred        Extremities: Normal range of motion.  Edema: None  Mental Status: Normal mood and affect. Normal behavior. Normal judgment and thought content.   Assessment and Plan:  Pregnancy: G2P1001 at [redacted]w[redacted]d 1. Encounter for supervision of other normal pregnancy in third trimester (Primary) Doing well, good FM, no concerns  2. [redacted] weeks gestation of pregnancy Tdap today   3. Poor fetal growth affecting management of mother in third trimester, single or unspecified fetus Growth U/S tomorrow w/ MFM. Previous u/s on 7/17 EFW 924gm 7%. Normal Doppler studies 7/31, AFI 9.65cm.  4. Fibroid U/S 7/31: lower left uterine segment 8.9 x 6.1 x  7.7 cm   Preterm labor symptoms and general obstetric precautions including but not limited to vaginal bleeding, contractions, leaking of fluid and fetal movement were reviewed in detail with the patient. Please refer to After Visit Summary for other counseling recommendations.    Future Appointments  Date Time Provider Department Center  09/01/2023  2:00 PM [facility] PROVIDER 1 [facility]  09/01/2023  2:15 PM [facility] US7 [facility]  09/01/2023  3:15 PM [facility] NST [facility]  09/08/2023  2:30 PM [facility] PROVIDER 1 [facility]  09/08/2023  3:00 PM [facility] US5 [facility]  09/08/2023  3:15 PM [facility] NST [facility]  09/14/2023  2:30 PM [name], MD [facility]  09/15/2023  1:15 PM [facility] PROVIDER 1 [facility]  09/15/2023  1:30 PM [facility] US4 [facility]  09/22/2023  1:00 PM [facility] PROVIDER 1 [facility]  09/22/2023  1:30 PM [facility] US1 [facility]    Elenor Mole, [facility] 08/31/23

## 2023-09-01 ENCOUNTER — Ambulatory Visit: Attending: Obstetrics

## 2023-09-01 ENCOUNTER — Ambulatory Visit: Admitting: Obstetrics

## 2023-09-01 ENCOUNTER — Ambulatory Visit (HOSPITAL_BASED_OUTPATIENT_CLINIC_OR_DEPARTMENT_OTHER)

## 2023-09-01 VITALS — BP 114/68 | HR 78

## 2023-09-01 DIAGNOSIS — O365930 Maternal care for other known or suspected poor fetal growth, third trimester, not applicable or unspecified: Secondary | ICD-10-CM | POA: Insufficient documentation

## 2023-09-01 DIAGNOSIS — Z362 Encounter for other antenatal screening follow-up: Secondary | ICD-10-CM | POA: Diagnosis not present

## 2023-09-01 DIAGNOSIS — O3413 Maternal care for benign tumor of corpus uteri, third trimester: Secondary | ICD-10-CM | POA: Insufficient documentation

## 2023-09-01 DIAGNOSIS — O365920 Maternal care for other known or suspected poor fetal growth, second trimester, not applicable or unspecified: Secondary | ICD-10-CM | POA: Insufficient documentation

## 2023-09-01 DIAGNOSIS — O43123 Velamentous insertion of umbilical cord, third trimester: Secondary | ICD-10-CM | POA: Insufficient documentation

## 2023-09-01 DIAGNOSIS — D259 Leiomyoma of uterus, unspecified: Secondary | ICD-10-CM | POA: Diagnosis not present

## 2023-09-01 DIAGNOSIS — Z3A30 30 weeks gestation of pregnancy: Secondary | ICD-10-CM

## 2023-09-01 DIAGNOSIS — D219 Benign neoplasm of connective and other soft tissue, unspecified: Secondary | ICD-10-CM | POA: Insufficient documentation

## 2023-09-01 DIAGNOSIS — O3412 Maternal care for benign tumor of corpus uteri, second trimester: Secondary | ICD-10-CM | POA: Diagnosis not present

## 2023-09-01 NOTE — Procedures (Signed)
 Lindsey Harvey [birth year] [redacted]w[redacted]d  Fetus A Non-Stress Test Interpretation for 09/01/23  Indication: IUGR  Fetal Heart Rate A Mode: External Baseline Rate (A): 135 bpm Variability: Moderate Accelerations: 15 x 15 Decelerations: None  Uterine Activity Mode: Toco, Palpation Contraction Frequency (min): none noted Resting Tone Palpated: Relaxed  Interpretation (Fetal Testing) Nonstress Test Interpretation: Reactive Comments: Reviewed with Dr. Ileana

## 2023-09-01 NOTE — Progress Notes (Addendum)
 MFM Consult Note  Lindsey Harvey is currently at 30 weeks and 4 days.  She has been followed due to IUGR and a fibroid uterus.    She denies any problems since her last exam and reports feeling fetal movements throughout the day.    On today's exam, the EFW of 2 pounds 15 ounces measures at the 5th percentile for her gestational age indicating IUGR.  The fetus grew 14 ounces over the past 3 weeks.    The total AFI was 22.18 cm (within normal limits).  Fetal movements were noted throughout today's exam.  Doppler studies of the umbilical arteries showed an elevated S/D ratio of 3.23.  There were no signs of absent or reversed end-diastolic flow.    She had a reactive NST today.  Due to IUGR, we will continue to follow her with weekly fetal testing and umbilical artery Doppler studies.    The large 11 cm lower uterine segment fibroid continues to be noted on today's exam.  We will perform a transvaginal ultrasound later in her pregnancy to assess if she can attempt a vaginal delivery.  She will return in 1 week for an NST and umbilical artery Doppler study.    The patient stated that all of her questions were answered today.  A total of 20 minutes was spent counseling and coordinating the care for this patient.  Greater than 50% of the time was spent in direct face-to-face contact.

## 2023-09-06 DIAGNOSIS — Z419 Encounter for procedure for purposes other than remedying health state, unspecified: Secondary | ICD-10-CM | POA: Diagnosis not present

## 2023-09-08 ENCOUNTER — Ambulatory Visit: Attending: Obstetrics and Gynecology

## 2023-09-08 ENCOUNTER — Ambulatory Visit (HOSPITAL_BASED_OUTPATIENT_CLINIC_OR_DEPARTMENT_OTHER): Admitting: Obstetrics and Gynecology

## 2023-09-08 ENCOUNTER — Ambulatory Visit (HOSPITAL_BASED_OUTPATIENT_CLINIC_OR_DEPARTMENT_OTHER)

## 2023-09-08 VITALS — BP 110/71 | HR 87

## 2023-09-08 DIAGNOSIS — O43193 Other malformation of placenta, third trimester: Secondary | ICD-10-CM | POA: Diagnosis not present

## 2023-09-08 DIAGNOSIS — D219 Benign neoplasm of connective and other soft tissue, unspecified: Secondary | ICD-10-CM | POA: Diagnosis not present

## 2023-09-08 DIAGNOSIS — Z3A31 31 weeks gestation of pregnancy: Secondary | ICD-10-CM

## 2023-09-08 DIAGNOSIS — O43123 Velamentous insertion of umbilical cord, third trimester: Secondary | ICD-10-CM | POA: Insufficient documentation

## 2023-09-08 DIAGNOSIS — O365930 Maternal care for other known or suspected poor fetal growth, third trimester, not applicable or unspecified: Secondary | ICD-10-CM | POA: Insufficient documentation

## 2023-09-08 DIAGNOSIS — D259 Leiomyoma of uterus, unspecified: Secondary | ICD-10-CM | POA: Diagnosis not present

## 2023-09-08 DIAGNOSIS — O3413 Maternal care for benign tumor of corpus uteri, third trimester: Secondary | ICD-10-CM

## 2023-09-08 DIAGNOSIS — O365920 Maternal care for other known or suspected poor fetal growth, second trimester, not applicable or unspecified: Secondary | ICD-10-CM | POA: Diagnosis not present

## 2023-09-08 NOTE — Procedures (Signed)
 Lilymae Cherne Jul 01, 1990 [redacted]w[redacted]d  Fetus A Non-Stress Test Interpretation for 09/08/23  Indication: IUGR, uterine fibroid and velamentous cord insertion  Fetal Heart Rate A Mode: External Baseline Rate (A): 130 bpm Variability: Moderate Accelerations: 15 x 15 Decelerations: None Multiple birth?: No  Uterine Activity Mode: Palpation, Toco Contraction Frequency (min): none noted Resting Tone Palpated: Relaxed  Interpretation (Fetal Testing) Nonstress Test Interpretation: Reactive Comments: Reviewed with Dr. Arna

## 2023-09-08 NOTE — Progress Notes (Signed)
 After review, MFM consult with provider is not indicated for today  Arna Ranks, MD 09/08/2023 3:44 PM  [facility]

## 2023-09-14 ENCOUNTER — Ambulatory Visit: Admitting: Obstetrics and Gynecology

## 2023-09-14 VITALS — BP 105/73 | HR 101 | Wt 152.1 lb

## 2023-09-14 DIAGNOSIS — Z3A32 32 weeks gestation of pregnancy: Secondary | ICD-10-CM

## 2023-09-14 DIAGNOSIS — D219 Benign neoplasm of connective and other soft tissue, unspecified: Secondary | ICD-10-CM

## 2023-09-14 DIAGNOSIS — Z3483 Encounter for supervision of other normal pregnancy, third trimester: Secondary | ICD-10-CM

## 2023-09-14 DIAGNOSIS — O365930 Maternal care for other known or suspected poor fetal growth, third trimester, not applicable or unspecified: Secondary | ICD-10-CM | POA: Diagnosis not present

## 2023-09-14 NOTE — Progress Notes (Signed)
   PRENATAL VISIT NOTE  Subjective:  Lindsey Harvey is a 33 y.o. G2P1001 at [redacted]w[redacted]d being seen today for ongoing prenatal care.  She is currently monitored for the following issues for this low-risk pregnancy and has Supervision of normal pregnancy; Anemia - resolved with PO iron; Fibroid; IUGR (intrauterine growth restriction) affecting care of mother; and Velamentous insertion of umbilical cord on their problem list.  Patient reports no complaints.  Contractions: Not present. Vag. Bleeding: None.  Movement: Present. Denies leaking of fluid.   The following portions of the patient's history were reviewed and updated as appropriate: allergies, current medications, past family history, past medical history, past social history, past surgical history and problem list.   Objective:    Vitals:   09/14/23 1439  BP: 105/73  Pulse: (!) 101  Weight: 69 kg    Fetal Status:  Fetal Heart Rate (bpm): 135 Fundal Height: 30 cm Movement: Present    General: Alert, oriented and cooperative. Patient is in no acute distress.  Skin: Skin is warm and dry. No rash noted.   Cardiovascular: Normal heart rate noted  Respiratory: Normal respiratory effort, no problems with respiration noted  Abdomen: Soft, gravid, appropriate for gestational age.  Pain/Pressure: Absent     Pelvic: Cervical exam deferred   Extremities: Normal range of motion.  Edema: None  Mental Status: Normal mood and affect. Normal behavior. Normal judgment and thought content.   Assessment and Plan:  Pregnancy: G2P1001 at [redacted]w[redacted]d 1. Encounter for supervision of other normal pregnancy in third trimester (Primary) BP WNL, doing well. Good fetal movement  2. Poor fetal growth affecting management of mother in third trimester, single or unspecified fetus Growth U/S 8/7 EFW 1324 gm 5 %.  Having weekly scans & cord dopplers. Last cord doppler 8/14 with normal forward flow. Has appt tomorrow with MFM   3. Fibroid 8/14 U/S:  Left ant LUS 11.14 x  7.49 x 0.44 Intramural    Preterm labor symptoms and general obstetric precautions including but not limited to vaginal bleeding, contractions, leaking of fluid and fetal movement were reviewed in detail with the patient. Please refer to After Visit Summary for other counseling recommendations.   Return in 2 weeks (on 09/28/2023) for ROB.  Future Appointments  Date Time Provider Department Center  09/15/2023  1:15 PM [facility] PROVIDER 1 [facility]  09/15/2023  1:30 PM [facility] US4 [facility]  09/15/2023  2:15 PM [facility] NST [facility]  09/22/2023  1:00 PM [facility] PROVIDER 1 [facility]  09/22/2023  1:30 PM [facility] US1 [facility]  09/22/2023  2:15 PM [facility] NST [facility]    Elenor Mole, [facility] 09/14/23

## 2023-09-15 ENCOUNTER — Ambulatory Visit (HOSPITAL_BASED_OUTPATIENT_CLINIC_OR_DEPARTMENT_OTHER): Admitting: Obstetrics

## 2023-09-15 ENCOUNTER — Ambulatory Visit: Attending: Obstetrics

## 2023-09-15 ENCOUNTER — Other Ambulatory Visit: Payer: Self-pay | Admitting: Obstetrics

## 2023-09-15 ENCOUNTER — Other Ambulatory Visit: Payer: Self-pay | Admitting: *Deleted

## 2023-09-15 ENCOUNTER — Ambulatory Visit

## 2023-09-15 VITALS — BP 118/71 | HR 90

## 2023-09-15 DIAGNOSIS — Z3A32 32 weeks gestation of pregnancy: Secondary | ICD-10-CM

## 2023-09-15 DIAGNOSIS — O3413 Maternal care for benign tumor of corpus uteri, third trimester: Secondary | ICD-10-CM | POA: Insufficient documentation

## 2023-09-15 DIAGNOSIS — Z362 Encounter for other antenatal screening follow-up: Secondary | ICD-10-CM | POA: Insufficient documentation

## 2023-09-15 DIAGNOSIS — D259 Leiomyoma of uterus, unspecified: Secondary | ICD-10-CM

## 2023-09-15 DIAGNOSIS — D219 Benign neoplasm of connective and other soft tissue, unspecified: Secondary | ICD-10-CM

## 2023-09-15 DIAGNOSIS — O365930 Maternal care for other known or suspected poor fetal growth, third trimester, not applicable or unspecified: Secondary | ICD-10-CM | POA: Insufficient documentation

## 2023-09-15 DIAGNOSIS — O365920 Maternal care for other known or suspected poor fetal growth, second trimester, not applicable or unspecified: Secondary | ICD-10-CM | POA: Insufficient documentation

## 2023-09-15 DIAGNOSIS — O43123 Velamentous insertion of umbilical cord, third trimester: Secondary | ICD-10-CM

## 2023-09-15 DIAGNOSIS — Z3483 Encounter for supervision of other normal pregnancy, third trimester: Secondary | ICD-10-CM

## 2023-09-15 NOTE — Progress Notes (Signed)
 MFM Consult Note  Lindsey Harvey is currently at 32 weeks and 4 days.  She has been followed due to IUGR and a fibroid uterus.    She denies any problems since her last exam and reports feeling fetal movements throughout the day.  A biophysical profile performed today was 8/8.  There was normal amniotic fluid noted today with a total AFI of 20.67 cm.  Doppler studies of the umbilical arteries showed a normal S/D ratio of 3.3.  There were no signs of absent or reversed end-diastolic flow.    Due to IUGR, we will continue to follow her with weekly fetal testing and umbilical artery Doppler studies.    The large 11 cm lower uterine segment fibroid continues to be noted on today's exam.  We will perform a transvaginal ultrasound at around 36 weeks to assess if she can attempt a vaginal delivery.  As the fibroid appears to be off to the side, I believe she probably will be able to attempt a vaginal birth.  We will double check with the transvaginal ultrasound.  She will return in 1 week for a growth scan, BPP, and umbilical artery Doppler study.    Should IUGR continue to be noted, delivery will be recommended at around 38 weeks.  The patient stated that all of her questions were answered today.  A total of 20 minutes was spent counseling and coordinating the care for this patient.  Greater than 50% of the time was spent in direct face-to-face contact.

## 2023-09-22 ENCOUNTER — Ambulatory Visit

## 2023-09-22 ENCOUNTER — Ambulatory Visit: Admitting: Maternal & Fetal Medicine

## 2023-09-22 ENCOUNTER — Ambulatory Visit: Attending: Obstetrics and Gynecology

## 2023-09-22 VITALS — BP 106/71 | HR 84

## 2023-09-22 DIAGNOSIS — O365930 Maternal care for other known or suspected poor fetal growth, third trimester, not applicable or unspecified: Secondary | ICD-10-CM

## 2023-09-22 DIAGNOSIS — O3413 Maternal care for benign tumor of corpus uteri, third trimester: Secondary | ICD-10-CM

## 2023-09-22 DIAGNOSIS — O43123 Velamentous insertion of umbilical cord, third trimester: Secondary | ICD-10-CM | POA: Insufficient documentation

## 2023-09-22 DIAGNOSIS — O3412 Maternal care for benign tumor of corpus uteri, second trimester: Secondary | ICD-10-CM | POA: Diagnosis not present

## 2023-09-22 DIAGNOSIS — D259 Leiomyoma of uterus, unspecified: Secondary | ICD-10-CM

## 2023-09-22 DIAGNOSIS — O365920 Maternal care for other known or suspected poor fetal growth, second trimester, not applicable or unspecified: Secondary | ICD-10-CM | POA: Insufficient documentation

## 2023-09-22 DIAGNOSIS — Z3A33 33 weeks gestation of pregnancy: Secondary | ICD-10-CM | POA: Diagnosis not present

## 2023-09-22 DIAGNOSIS — O43129 Velamentous insertion of umbilical cord, unspecified trimester: Secondary | ICD-10-CM

## 2023-09-22 DIAGNOSIS — D219 Benign neoplasm of connective and other soft tissue, unspecified: Secondary | ICD-10-CM | POA: Diagnosis not present

## 2023-09-22 DIAGNOSIS — O0993 Supervision of high risk pregnancy, unspecified, third trimester: Secondary | ICD-10-CM | POA: Insufficient documentation

## 2023-09-22 NOTE — Progress Notes (Signed)
 Patient information  Patient Name: Lindsey Harvey  Patient MRN:   [id number]  Referring practice: MFM Referring Provider: [place] [facility] Bonni SHIPPER  Problem List   Patient Active Problem List   Diagnosis Date Noted   Velamentous insertion of umbilical cord, third trimester 08/25/2023   Intrauterine growth restriction (IUGR) affecting care of mother, third trimester, single gestation (resolved) 08/22/2023   Fibroid 06/23/2023   Anemia - resolved with PO iron 04/19/2023   Supervision of high risk pregnancy, antepartum, third trimester 04/08/2023    Maternal Fetal medicine Consult  CASSIE SHEDLOCK is a 33 y.o. G2P1001 at [redacted]w[redacted]d here for ultrasound and consultation. Shalinda Housey is doing well today with no acute concerns. Today we focused on the following:   The patient is here due to growth restriction as well as a large left lateral uterine fibroid.  The fibroid measures approximately 15 cm.  Today the fetal head has distended just beyond the most distal portion of the fibroid.  This is reassuring that the patient may be allowed a vaginal birth.  This will be assessed on future ultrasounds.  The patient is no longer growth restricted with the estimated fetal weight at the 4th percentile and the abdominal circumference is at the 38th percentile.  I discussed that she no longer needs umbilical artery Dopplers but we will continue antenatal testing for the next 3 weeks until her next growth ultrasound.  The patient had time to ask questions that were answered to her satisfaction.  She verbalized understanding and agrees to proceed with the plan below.  Sonographic findings Single intrauterine pregnancy. Fetal cardiac activity: Observed. Presentation: Cephalic. Interval fetal anatomy appears normal. Fetal biometry shows the estimated fetal weight at the 14 percentile. Amniotic fluid: Within normal limits.  MVP: 6.39 cm. Placenta: Anterior. BPP: 8/8.   There are limitations of prenatal ultrasound  such as the inability to detect certain abnormalities due to poor visualization. Various factors such as fetal position, gestational age and maternal body habitus may increase the difficulty in visualizing the fetal anatomy.    Recommendations - She no longer needs umbilical artery Dopplers but we will continue antenatal testing for the next 3 weeks until her next growth ultrasound.  Review of Systems: A review of systems was performed and was negative except per HPI   Vitals and Physical Exam    09/22/2023    1:11 PM 09/15/2023    1:23 PM 09/14/2023    2:39 PM  Vitals with BMI  Weight   152 lbs 1 oz  Systolic 106 118 894  Diastolic 71 71 73  Pulse 84 90 101    Sitting comfortably on the sonogram table Nonlabored breathing Normal rate and rhythm Abdomen is nontender  Past pregnancies OB History  Gravida Para Term Preterm AB Living  2 1 1  0 0 1  SAB IAB Ectopic Multiple Live Births  0 0 0 0 1    # Outcome Date GA Lbr Len/2nd Weight Sex Type Anes PTL Lv  2 Current           1 Term 07/11/17 [redacted]w[redacted]d 11:31 / 01:47 7 lb 10.4 oz (3.47 kg) F Vag-Spont EPI  LIV     I spent 20 minutes reviewing the patients chart, including labs and images as well as counseling the patient about her medical conditions. Greater than 50% of the time was spent in direct face-to-face patient counseling.  [name]  MFM, [place]   09/22/2023  2:16 PM

## 2023-09-29 ENCOUNTER — Ambulatory Visit: Admitting: Obstetrics and Gynecology

## 2023-09-29 VITALS — BP 113/77 | HR 98 | Wt 156.0 lb

## 2023-09-29 DIAGNOSIS — O0993 Supervision of high risk pregnancy, unspecified, third trimester: Secondary | ICD-10-CM

## 2023-09-29 DIAGNOSIS — O43123 Velamentous insertion of umbilical cord, third trimester: Secondary | ICD-10-CM | POA: Diagnosis not present

## 2023-09-29 DIAGNOSIS — O365930 Maternal care for other known or suspected poor fetal growth, third trimester, not applicable or unspecified: Secondary | ICD-10-CM | POA: Diagnosis not present

## 2023-09-29 DIAGNOSIS — Z3A34 34 weeks gestation of pregnancy: Secondary | ICD-10-CM

## 2023-09-29 NOTE — Progress Notes (Signed)
   PRENATAL VISIT NOTE  Subjective:  Lindsey Harvey is a 33 y.o. G2P1001 at [redacted]w[redacted]d being seen today for ongoing prenatal care.  She is currently monitored for the following issues for this high-risk pregnancy and has Supervision of high risk pregnancy, antepartum, third trimester; Anemia - resolved with PO iron; Fibroid; Intrauterine growth restriction (IUGR) affecting care of mother, third trimester, single gestation (resolved); and Velamentous insertion of umbilical cord, third trimester on their problem list.  Patient reports no complaints.  Contractions: Not present. Vag. Bleeding: None.  Movement: Present. Denies leaking of fluid.   The following portions of the patient's history were reviewed and updated as appropriate: allergies, current medications, past family history, past medical history, past social history, past surgical history and problem list.   Objective:    Vitals:   09/29/23 1438  BP: 113/77  Pulse: 98  Weight: 156 lb (70.8 kg)    Fetal Status:  Fetal Heart Rate (bpm): 135   Movement: Present    General: Alert, oriented and cooperative. Patient is in no acute distress.  Skin: Skin is warm and dry. No rash noted.   Cardiovascular: Normal heart rate noted  Respiratory: Normal respiratory effort, no problems with respiration noted  Abdomen: Soft, gravid, appropriate for gestational age.  Pain/Pressure: Absent     Pelvic: Cervical exam deferred        Extremities: Normal range of motion.  Edema: None  Mental Status: Normal mood and affect. Normal behavior. Normal judgment and thought content.   Assessment and Plan:  Pregnancy: G2P1001 at [redacted]w[redacted]d 1. Intrauterine growth restriction (IUGR) affecting care of mother, third trimester, single gestation (resolved) (Primary)  - Resolved  - EFW now 14%tile  - Fetal nonstress test  2. Supervision of high risk pregnancy, antepartum, third trimester  -Had to cancel MFM BPP tomorrow due to schedule. NST today -Has MFM appts schedule  for next week.  -Fetal nonstress test  3. Velamentous insertion of umbilical cord, third trimester  - Fetal nonstress test - Baseline 120, 15x15 accels, no decels. Moderate variability   Preterm labor symptoms and general obstetric precautions including but not limited to vaginal bleeding, contractions, leaking of fluid and fetal movement were reviewed in detail with the patient. Please refer to After Visit Summary for other counseling recommendations.   No follow-ups on file.  Future Appointments  Date Time Provider Department Center  10/05/2023  7:15 AM [facility] PROVIDER 1 [facility]  10/05/2023  7:30 AM [facility] US3 [facility]  10/12/2023  2:15 PM [facility] PROVIDER 1 [facility]  10/12/2023  2:30 PM [facility] US1 [facility]  10/13/2023  3:30 PM [name], MD [facility]    Delon Emms, NP

## 2023-09-29 NOTE — Progress Notes (Signed)
 Denies concerns today.

## 2023-09-30 ENCOUNTER — Ambulatory Visit

## 2023-09-30 ENCOUNTER — Other Ambulatory Visit

## 2023-10-05 ENCOUNTER — Ambulatory Visit (HOSPITAL_BASED_OUTPATIENT_CLINIC_OR_DEPARTMENT_OTHER)

## 2023-10-05 ENCOUNTER — Other Ambulatory Visit: Payer: Self-pay | Admitting: *Deleted

## 2023-10-05 ENCOUNTER — Ambulatory Visit: Attending: Maternal & Fetal Medicine | Admitting: Maternal & Fetal Medicine

## 2023-10-05 VITALS — BP 108/69 | HR 86

## 2023-10-05 DIAGNOSIS — O365920 Maternal care for other known or suspected poor fetal growth, second trimester, not applicable or unspecified: Secondary | ICD-10-CM

## 2023-10-05 DIAGNOSIS — D259 Leiomyoma of uterus, unspecified: Secondary | ICD-10-CM | POA: Diagnosis not present

## 2023-10-05 DIAGNOSIS — D219 Benign neoplasm of connective and other soft tissue, unspecified: Secondary | ICD-10-CM

## 2023-10-05 DIAGNOSIS — O43123 Velamentous insertion of umbilical cord, third trimester: Secondary | ICD-10-CM | POA: Diagnosis not present

## 2023-10-05 DIAGNOSIS — O365930 Maternal care for other known or suspected poor fetal growth, third trimester, not applicable or unspecified: Secondary | ICD-10-CM | POA: Insufficient documentation

## 2023-10-05 DIAGNOSIS — Z362 Encounter for other antenatal screening follow-up: Secondary | ICD-10-CM | POA: Diagnosis not present

## 2023-10-05 DIAGNOSIS — Z3A35 35 weeks gestation of pregnancy: Secondary | ICD-10-CM

## 2023-10-05 DIAGNOSIS — O3412 Maternal care for benign tumor of corpus uteri, second trimester: Secondary | ICD-10-CM | POA: Insufficient documentation

## 2023-10-05 DIAGNOSIS — O0993 Supervision of high risk pregnancy, unspecified, third trimester: Secondary | ICD-10-CM

## 2023-10-05 DIAGNOSIS — O3413 Maternal care for benign tumor of corpus uteri, third trimester: Secondary | ICD-10-CM

## 2023-10-05 NOTE — Progress Notes (Signed)
 Patient information  Patient Name: Lindsey Harvey  Patient MRN:   [id number]  Referring practice: MFM Referring Provider: [place] - Bonni SHIPPER  Problem List   Patient Active Problem List   Diagnosis Date Noted   Velamentous insertion of umbilical cord, third trimester 08/25/2023   Intrauterine growth restriction (IUGR) affecting care of mother, third trimester, single gestation (resolved) 08/22/2023   Fibroid 06/23/2023   Anemia - resolved with PO iron 04/19/2023   Supervision of high risk pregnancy, antepartum, third trimester 04/08/2023   Maternal Fetal medicine Consult  Lindsey Harvey is a 33 y.o. G2P1001 at [redacted]w[redacted]d here for ultrasound and consultation. Lindsey Harvey is doing well today with no acute concerns. Today we focused on the following:   The patient is here due to growth restriction as well as a large left lateral uterine fibroid.  The fibroid measures approximately 15 cm.  Today the fetal head is transverse compared to the fibroid.  There is a large amount of amniotic fluid by the cervix.  Previously a transvaginal ultrasound was recommended to assess if the fetal head could descend past the fibroid.  On previous ultrasounds when the fetus was cephalic this was demonstrated.  I do not think she needs a transvaginal ultrasound at 36 weeks but this can be done if the fetal head is poorly visualized transabdominally.  I discussed the breech presentation as well as the possible need for either a version or cesarean delivery.  The patient verbalized understanding and we will reassess this on future ultrasounds.  The patient had time to ask questions that were answered to her satisfaction.  She verbalized understanding and agrees to proceed with the plan below.  Sonographic findings Single intrauterine pregnancy. Fetal cardiac activity: Observed. Presentation: Transverse, head to maternal left. Interval fetal anatomy appears normal. Fetal biometry shows the estimated fetal weight at the   percentile. Amniotic fluid: Within normal limits.  MVP: 5.29 cm. Placenta: Anterior. BPP: 8/8.   There are limitations of prenatal ultrasound such as the inability to detect certain abnormalities due to poor visualization. Various factors such as fetal position, gestational age and maternal body habitus may increase the difficulty in visualizing the fetal anatomy.    Recommendations - Continue antenatal testing due to velamentous cord insertion - Reassess presentation at next ultrasound  Review of Systems: A review of systems was performed and was negative except per HPI   Vitals and Physical Exam    10/05/2023    7:30 AM 09/29/2023    2:38 PM 09/22/2023    1:11 PM  Vitals with BMI  Weight  156 lbs   Systolic 108 113 893  Diastolic 69 77 71  Pulse 86 98 84    Sitting comfortably on the sonogram table Nonlabored breathing Normal rate and rhythm Abdomen is nontender  Past pregnancies OB History  Gravida Para Term Preterm AB Living  2 1 1  0 0 1  SAB IAB Ectopic Multiple Live Births  0 0 0 0 1    # Outcome Date GA Lbr Len/2nd Weight Sex Type Anes PTL Lv  2 Current           1 Term 07/11/17 [redacted]w[redacted]d 11:31 / 01:47 7 lb 10.4 oz (3.47 kg) F Vag-Spont EPI  LIV     I spent 30 minutes reviewing the patients chart, including labs and images as well as counseling the patient about her medical conditions. Greater than 50% of the time was spent in direct face-to-face patient counseling.  Lindsey Harvey  Lindsey Harvey  MFM, [facility]   10/05/2023  8:46 AM

## 2023-10-07 ENCOUNTER — Other Ambulatory Visit

## 2023-10-07 ENCOUNTER — Ambulatory Visit

## 2023-10-07 DIAGNOSIS — Z419 Encounter for procedure for purposes other than remedying health state, unspecified: Secondary | ICD-10-CM | POA: Diagnosis not present

## 2023-10-12 ENCOUNTER — Ambulatory Visit (HOSPITAL_BASED_OUTPATIENT_CLINIC_OR_DEPARTMENT_OTHER)

## 2023-10-12 ENCOUNTER — Ambulatory Visit: Attending: Obstetrics and Gynecology | Admitting: Maternal & Fetal Medicine

## 2023-10-12 VITALS — BP 118/78 | HR 92

## 2023-10-12 DIAGNOSIS — D259 Leiomyoma of uterus, unspecified: Secondary | ICD-10-CM | POA: Diagnosis not present

## 2023-10-12 DIAGNOSIS — O321XX0 Maternal care for breech presentation, not applicable or unspecified: Secondary | ICD-10-CM | POA: Insufficient documentation

## 2023-10-12 DIAGNOSIS — O365930 Maternal care for other known or suspected poor fetal growth, third trimester, not applicable or unspecified: Secondary | ICD-10-CM | POA: Insufficient documentation

## 2023-10-12 DIAGNOSIS — O403XX0 Polyhydramnios, third trimester, not applicable or unspecified: Secondary | ICD-10-CM | POA: Diagnosis not present

## 2023-10-12 DIAGNOSIS — Z3A36 36 weeks gestation of pregnancy: Secondary | ICD-10-CM | POA: Diagnosis not present

## 2023-10-12 DIAGNOSIS — O0993 Supervision of high risk pregnancy, unspecified, third trimester: Secondary | ICD-10-CM

## 2023-10-12 DIAGNOSIS — O365920 Maternal care for other known or suspected poor fetal growth, second trimester, not applicable or unspecified: Secondary | ICD-10-CM

## 2023-10-12 DIAGNOSIS — D219 Benign neoplasm of connective and other soft tissue, unspecified: Secondary | ICD-10-CM

## 2023-10-12 DIAGNOSIS — O3413 Maternal care for benign tumor of corpus uteri, third trimester: Secondary | ICD-10-CM | POA: Insufficient documentation

## 2023-10-12 DIAGNOSIS — O43193 Other malformation of placenta, third trimester: Secondary | ICD-10-CM | POA: Diagnosis not present

## 2023-10-12 DIAGNOSIS — O43123 Velamentous insertion of umbilical cord, third trimester: Secondary | ICD-10-CM

## 2023-10-13 ENCOUNTER — Encounter: Admitting: Obstetrics and Gynecology

## 2023-10-14 ENCOUNTER — Ambulatory Visit

## 2023-10-17 NOTE — Progress Notes (Signed)
 After review, MFM consult with provider is not indicated for today  Lindsey Nathanel Pipe, MD 10/17/2023 9:26 AM  [facility]

## 2023-10-18 ENCOUNTER — Ambulatory Visit (INDEPENDENT_AMBULATORY_CARE_PROVIDER_SITE_OTHER): Admitting: Obstetrics and Gynecology

## 2023-10-18 ENCOUNTER — Other Ambulatory Visit (HOSPITAL_COMMUNITY)
Admission: RE | Admit: 2023-10-18 | Discharge: 2023-10-18 | Disposition: A | Discharge status: Home / Self Care | Source: Ambulatory Visit | Attending: Obstetrics and Gynecology | Admitting: Obstetrics and Gynecology

## 2023-10-18 VITALS — BP 107/75 | HR 93 | Wt 156.1 lb

## 2023-10-18 DIAGNOSIS — Z3493 Encounter for supervision of normal pregnancy, unspecified, third trimester: Secondary | ICD-10-CM | POA: Diagnosis not present

## 2023-10-18 DIAGNOSIS — O0993 Supervision of high risk pregnancy, unspecified, third trimester: Secondary | ICD-10-CM | POA: Diagnosis not present

## 2023-10-18 DIAGNOSIS — O365930 Maternal care for other known or suspected poor fetal growth, third trimester, not applicable or unspecified: Secondary | ICD-10-CM

## 2023-10-18 DIAGNOSIS — Z3A37 37 weeks gestation of pregnancy: Secondary | ICD-10-CM | POA: Insufficient documentation

## 2023-10-18 DIAGNOSIS — D219 Benign neoplasm of connective and other soft tissue, unspecified: Secondary | ICD-10-CM

## 2023-10-18 NOTE — Progress Notes (Signed)
   PRENATAL VISIT NOTE  Subjective:  Lindsey Harvey is a 33 y.o. G2P1001 at [redacted]w[redacted]d being seen today for ongoing prenatal care.  She is currently monitored for the following issues for this high-risk pregnancy and has Supervision of high risk pregnancy, antepartum, third trimester; Anemia - resolved with PO iron; Fibroid; Intrauterine growth restriction (IUGR) affecting care of mother, third trimester, single gestation (resolved); and Velamentous insertion of umbilical cord, third trimester on their problem list.  Patient reports no complaints.  Contractions: Irregular [name]). Vag. Bleeding: None.  Movement: Present. Denies leaking of fluid.   The following portions of the patient's history were reviewed and updated as appropriate: allergies, current medications, past family history, past medical history, past social history, past surgical history and problem list.   Objective:    Vitals:   10/18/23 1452  BP: 107/75  Pulse: 93  Weight: 70.8 kg    Fetal Status:  Fetal Heart Rate (bpm): 136 Fundal Height: 36 cm Movement: Present   General: Alert, oriented and cooperative. Patient is in no acute distress.  Skin: Skin is warm and dry. No rash noted.   Cardiovascular: Normal heart rate noted  Respiratory: Normal respiratory effort, no problems with respiration noted  Abdomen: Soft, gravid, appropriate for gestational age.  Pain/Pressure: Absent     Pelvic: Cervical exam deferred   Extremities: Normal range of motion.  Edema: None  Mental Status: Normal mood and affect. Normal behavior. Normal judgment and thought content.   Assessment and Plan:  Pregnancy: G2P1001 at [redacted]w[redacted]d 1. [redacted] weeks gestation of pregnancy (Primary) - Cervicovaginal ancillary only (GC/Chlamydia) - Strep Gp B NAA  2. Fibroid - Per MFM, she will likely require a cesaran delivery given fetal/fibroid orientation. Scheduled to see Dr. [name] on Thursday, 10/2.  3. Supervision of high risk pregnancy, antepartum, third  trimester - BP WNL - + FM - Continue PNV - Weekly OB visits  4. Intrauterine growth restriction (IUGR) affecting care of mother, third trimester, single gestation (resolved) - OB US  10/12/23 - EFW at 15%ile  Term labor symptoms and general obstetric precautions including but not limited to vaginal bleeding, contractions, leaking of fluid and fetal movement were reviewed in detail with the patient.  Please refer to After Visit Summary for other counseling recommendations.   No follow-ups on file.  Future Appointments  Date Time Provider Department Center  10/19/2023 11:15 AM [facility] PROVIDER 1 [facility]  10/19/2023 11:30 AM [facility] US5 [facility]  10/20/2023 10:30 AM [name], MD [facility]  10/26/2023  8:15 AM [facility] PROVIDER 1 [facility]  10/26/2023  8:30 AM [facility] US4 [facility]  10/27/2023  4:10 PM [name], MD [facility]  11/03/2023  9:30 AM [name], FNP [facility]    Debby [name], RN

## 2023-10-19 ENCOUNTER — Ambulatory Visit

## 2023-10-19 ENCOUNTER — Ambulatory Visit: Attending: Maternal & Fetal Medicine | Admitting: Obstetrics

## 2023-10-19 DIAGNOSIS — Z3A37 37 weeks gestation of pregnancy: Secondary | ICD-10-CM | POA: Diagnosis not present

## 2023-10-19 DIAGNOSIS — D219 Benign neoplasm of connective and other soft tissue, unspecified: Secondary | ICD-10-CM

## 2023-10-19 DIAGNOSIS — O365930 Maternal care for other known or suspected poor fetal growth, third trimester, not applicable or unspecified: Secondary | ICD-10-CM | POA: Insufficient documentation

## 2023-10-19 DIAGNOSIS — D259 Leiomyoma of uterus, unspecified: Secondary | ICD-10-CM | POA: Diagnosis not present

## 2023-10-19 DIAGNOSIS — O3413 Maternal care for benign tumor of corpus uteri, third trimester: Secondary | ICD-10-CM | POA: Diagnosis not present

## 2023-10-19 DIAGNOSIS — O321XX0 Maternal care for breech presentation, not applicable or unspecified: Secondary | ICD-10-CM | POA: Insufficient documentation

## 2023-10-19 LAB — CERVICOVAGINAL ANCILLARY ONLY
Chlamydia: NEGATIVE
Comment: NEGATIVE
Comment: NORMAL
Neisseria Gonorrhea: NEGATIVE

## 2023-10-19 NOTE — Progress Notes (Signed)
 MFM Consult Note  Lindsey Harvey is currently at 37 weeks and 3 days.  She has been followed due to previously noted IUGR and a fibroid uterus.  The EFW of 5 pounds 9 ounces measured at the 15th percentile last week, indicating that IUGR has resolved.  She denies any problems since her last exam and reports feeling fetal movements throughout the day.    A biophysical profile performed today was 8/8.   The AFI was 17.42 cm.   The fetus remains in the double footling breech presentation.  The 10 cm to 11 cm fibroid continues to be noted on the left lateral side of her uterus in the lower uterine segment.  She will return to our office in 1 week for another BPP.  We will assess the fetal position again next week.  The patient understands that should the fetus remain in the breech presentation, a cesarean delivery may be scheduled at around 39 weeks.  As the large fibroid is on the left lateral side of her uterus, a low transverse uterine incision may be made on the right side of her uterus for delivery.  The middle and the right side of her uterus is clear of any fibroids which would enable a low-transverse uterine incision to be made for delivery.  The patient stated that all of her questions were answered today.  A total of 20 minutes was spent counseling and coordinating the care for this patient.  Greater than 50% of the time was spent in direct face-to-face contact.

## 2023-10-20 ENCOUNTER — Encounter: Admitting: Obstetrics and Gynecology

## 2023-10-20 LAB — STREP GP B NAA: Strep Gp B NAA: NEGATIVE

## 2023-10-26 ENCOUNTER — Ambulatory Visit: Attending: Obstetrics and Gynecology | Admitting: Obstetrics and Gynecology

## 2023-10-26 ENCOUNTER — Ambulatory Visit

## 2023-10-26 DIAGNOSIS — O323XX0 Maternal care for face, brow and chin presentation, not applicable or unspecified: Secondary | ICD-10-CM

## 2023-10-26 DIAGNOSIS — Z3A38 38 weeks gestation of pregnancy: Secondary | ICD-10-CM | POA: Insufficient documentation

## 2023-10-26 DIAGNOSIS — O403XX0 Polyhydramnios, third trimester, not applicable or unspecified: Secondary | ICD-10-CM | POA: Diagnosis not present

## 2023-10-26 DIAGNOSIS — O3413 Maternal care for benign tumor of corpus uteri, third trimester: Secondary | ICD-10-CM | POA: Insufficient documentation

## 2023-10-26 DIAGNOSIS — Z3689 Encounter for other specified antenatal screening: Secondary | ICD-10-CM | POA: Diagnosis not present

## 2023-10-26 DIAGNOSIS — D259 Leiomyoma of uterus, unspecified: Secondary | ICD-10-CM | POA: Diagnosis not present

## 2023-10-26 DIAGNOSIS — D219 Benign neoplasm of connective and other soft tissue, unspecified: Secondary | ICD-10-CM

## 2023-10-26 DIAGNOSIS — Z362 Encounter for other antenatal screening follow-up: Secondary | ICD-10-CM | POA: Insufficient documentation

## 2023-10-26 DIAGNOSIS — O365930 Maternal care for other known or suspected poor fetal growth, third trimester, not applicable or unspecified: Secondary | ICD-10-CM | POA: Diagnosis present

## 2023-10-26 DIAGNOSIS — O320XX0 Maternal care for unstable lie, not applicable or unspecified: Secondary | ICD-10-CM | POA: Insufficient documentation

## 2023-10-26 DIAGNOSIS — O329XX0 Maternal care for malpresentation of fetus, unspecified, not applicable or unspecified: Secondary | ICD-10-CM

## 2023-10-26 DIAGNOSIS — O43123 Velamentous insertion of umbilical cord, third trimester: Secondary | ICD-10-CM | POA: Diagnosis not present

## 2023-10-26 NOTE — Progress Notes (Signed)
 Maternal-Fetal Medicine Consultation  Name: Lindsey Harvey  MRN: [id number]  GA: [id number] [redacted]w[redacted]d   Patient is here for antenatal testing.  Her problems include: -Large intramural myoma. - Velamentous cord insertion. - Fetal growth restriction that had resolved. Obstetrical history is significant for a term vaginal delivery.  Her pregnancy was not complicated by myoma. Ultrasound Mild polyhydramnios is seen (AFI 28 cm). Unstable lie.  At the beginning of ultrasound, cephalic presentation was seen that later turned to transverse lie with head to maternal right. Large intramural myoma was seen in the left lower uterine segment measuring 10.3 x 10.8 x 9 cm.  On transabdominal scan, the cervix is clearly seen and no myoma was seen overlying the internal os.  I counseled the patient on the following: Uterine myoma I discussed the findings with the help of ultrasound images and real-time scanning that the myoma is in the lower segment but to the left of the cervix and is, therefore, unlikely to obstruct delivery.  She can safely attempt vaginal delivery.  Mild polyhydramnios I reassured the patient that mild polyhydramnios in the absence of gestational diabetes is not associated with adverse outcomes.  Unstable lie and external cephalic version I discussed unstable lie and polyhydramnios could be the cause. I counseled the patient on malpresentation and that in most cases it will spontaneously revert to cephalic presentation. -If spontaneous version does not occur by 39-weeks' gestation, external cephalic version can be performed.  I briefly explained the procedure. Possible complication include nonreassuring fetal heart trace, rupture of membranes, fetomaternal hemorrhage, and abruption that may require emergency cesarean delivery (about 1%).  -Success rate of ECV is about 50% but is likely to be higher in her case because of unstable lie. -ECV can be performed under epidural analgesia.  If successful,  induction of labor can be carried out after the procedure. I counseled the patient that ECV reduces the likelihood of cesarean delivery and its complications. Induction of labor at 39-weeks' gestation is indicated for the following reasons: -Velamentous cord insertion complicating pregnancy. -Unstable lie with polyhydramnios (increases the risk of cord prolapse). Patient has a prenatal visit appointment tomorrow.  Recommendations -Induction of labor at [redacted] weeks gestation. - If malpresentation persists, external cephalic version should be attempted with or without epidural analgesia.  If successful, patient can have induction of labor after the procedure.     Consultation including face-to-face (more than 50%) counseling 30 minutes.

## 2023-10-27 ENCOUNTER — Ambulatory Visit (INDEPENDENT_AMBULATORY_CARE_PROVIDER_SITE_OTHER): Admitting: Obstetrics and Gynecology

## 2023-10-27 VITALS — BP 104/67 | HR 87 | Wt 158.0 lb

## 2023-10-27 DIAGNOSIS — O0993 Supervision of high risk pregnancy, unspecified, third trimester: Secondary | ICD-10-CM | POA: Diagnosis not present

## 2023-10-27 DIAGNOSIS — O403XX0 Polyhydramnios, third trimester, not applicable or unspecified: Secondary | ICD-10-CM

## 2023-10-27 DIAGNOSIS — O365930 Maternal care for other known or suspected poor fetal growth, third trimester, not applicable or unspecified: Secondary | ICD-10-CM

## 2023-10-27 DIAGNOSIS — Z3A38 38 weeks gestation of pregnancy: Secondary | ICD-10-CM | POA: Diagnosis not present

## 2023-10-27 DIAGNOSIS — O43123 Velamentous insertion of umbilical cord, third trimester: Secondary | ICD-10-CM | POA: Diagnosis not present

## 2023-10-27 DIAGNOSIS — D219 Benign neoplasm of connective and other soft tissue, unspecified: Secondary | ICD-10-CM

## 2023-10-27 NOTE — Progress Notes (Signed)
   PRENATAL VISIT NOTE  Subjective:  Lindsey Harvey is a 33 y.o. G2P1001 at [redacted]w[redacted]d being seen today for ongoing prenatal care.  She is currently monitored for the following issues for this high-risk pregnancy and has Supervision of high risk pregnancy, antepartum, third trimester; Anemia - resolved with PO iron; Fibroid; Intrauterine growth restriction (IUGR) affecting care of mother, third trimester, single gestation (resolved); and Velamentous insertion of umbilical cord, third trimester on their problem list.  Patient reports no complaints.  Contractions: Irritability. Vag. Bleeding: None.  Movement: Present. Denies leaking of fluid.   The following portions of the patient's history were reviewed and updated as appropriate: allergies, current medications, past family history, past medical history, past social history, past surgical history and problem list.   Objective:   Vitals:   10/27/23 1635  BP: 104/67  Pulse: 87  Weight: 158 lb (71.7 kg)    Fetal Status: Fetal Heart Rate (bpm): 140   Movement: Present     General:  Alert, oriented and cooperative. Patient is in no acute distress.  Skin: Skin is warm and dry. No rash noted.   Cardiovascular: Normal heart rate noted  Respiratory: Normal respiratory effort, no problems with respiration noted  Abdomen: Soft, gravid, appropriate for gestational age.  Pain/Pressure: Present      Assessment and Plan:  Pregnancy: G2P1001 at [redacted]w[redacted]d 1. Supervision of high risk pregnancy, antepartum, third trimester (Primary) 2. [redacted] weeks gestation of pregnancy GBS neg IOL scheduled & orders entered  3. Polyhydramnios in third trimester complication, single or unspecified fetus 4. Velamentous insertion of umbilical cord, third trimester 5. Intrauterine growth restriction (IUGR) affecting care of mother, third trimester, single gestation (resolved) Growth @ 36/3 2522g (15%), AC 25%, AFI 27.47 BPP yesterday w/ AFI 27.6 & variable lie  IOL ordered for 39  weeks - discussed potential ECV, early AROM, cord prolapse (low absolute risk but higher w/ poly), role for epidural  6. Fibroid Left LUS 10.8cm Reviewed by MFM, fibroid is to the left of the cervix; agree with trial of labor  Term labor symptoms and general obstetric precautions including but not limited to vaginal bleeding, contractions, leaking of fluid and fetal movement were reviewed in detail with the patient.  Please refer to After Visit Summary for other counseling recommendations.   Future Appointments  Date Time Provider Department Center  10/31/2023  7:00 AM [facility] SCHED ROOM [facility] None  11/03/2023  9:30 AM [name], FNP [facility]   Kieth JAYSON Carolin, MD

## 2023-10-28 ENCOUNTER — Encounter (HOSPITAL_COMMUNITY): Payer: Self-pay | Admitting: *Deleted

## 2023-10-28 ENCOUNTER — Telehealth (HOSPITAL_COMMUNITY): Payer: Self-pay | Admitting: *Deleted

## 2023-10-28 NOTE — Telephone Encounter (Signed)
 Preadmission screen

## 2023-10-31 ENCOUNTER — Inpatient Hospital Stay (HOSPITAL_COMMUNITY)
Admission: RE | Admit: 2023-10-31 | Discharge: 2023-11-02 | DRG: 807 | Disposition: A | Discharge status: Home / Self Care | Attending: Obstetrics and Gynecology | Admitting: Obstetrics and Gynecology

## 2023-10-31 ENCOUNTER — Inpatient Hospital Stay (HOSPITAL_COMMUNITY)

## 2023-10-31 ENCOUNTER — Other Ambulatory Visit: Payer: Self-pay

## 2023-10-31 ENCOUNTER — Encounter (HOSPITAL_COMMUNITY): Payer: Self-pay | Admitting: Family Medicine

## 2023-10-31 DIAGNOSIS — O43193 Other malformation of placenta, third trimester: Secondary | ICD-10-CM | POA: Diagnosis present

## 2023-10-31 DIAGNOSIS — O3413 Maternal care for benign tumor of corpus uteri, third trimester: Secondary | ICD-10-CM | POA: Diagnosis present

## 2023-10-31 DIAGNOSIS — O9952 Diseases of the respiratory system complicating childbirth: Secondary | ICD-10-CM | POA: Diagnosis not present

## 2023-10-31 DIAGNOSIS — Z3A39 39 weeks gestation of pregnancy: Secondary | ICD-10-CM | POA: Diagnosis not present

## 2023-10-31 DIAGNOSIS — D250 Submucous leiomyoma of uterus: Secondary | ICD-10-CM | POA: Diagnosis present

## 2023-10-31 DIAGNOSIS — O0993 Supervision of high risk pregnancy, unspecified, third trimester: Secondary | ICD-10-CM

## 2023-10-31 DIAGNOSIS — O403XX0 Polyhydramnios, third trimester, not applicable or unspecified: Principal | ICD-10-CM | POA: Diagnosis present

## 2023-10-31 DIAGNOSIS — Z8249 Family history of ischemic heart disease and other diseases of the circulatory system: Secondary | ICD-10-CM | POA: Diagnosis not present

## 2023-10-31 DIAGNOSIS — O322XX0 Maternal care for transverse and oblique lie, not applicable or unspecified: Secondary | ICD-10-CM | POA: Diagnosis present

## 2023-10-31 DIAGNOSIS — D649 Anemia, unspecified: Secondary | ICD-10-CM | POA: Diagnosis present

## 2023-10-31 DIAGNOSIS — O320XX0 Maternal care for unstable lie, not applicable or unspecified: Secondary | ICD-10-CM | POA: Diagnosis present

## 2023-10-31 DIAGNOSIS — J45909 Unspecified asthma, uncomplicated: Secondary | ICD-10-CM | POA: Diagnosis not present

## 2023-10-31 DIAGNOSIS — O43123 Velamentous insertion of umbilical cord, third trimester: Secondary | ICD-10-CM | POA: Diagnosis present

## 2023-10-31 DIAGNOSIS — O409XX0 Polyhydramnios, unspecified trimester, not applicable or unspecified: Secondary | ICD-10-CM | POA: Diagnosis present

## 2023-10-31 DIAGNOSIS — Z3490 Encounter for supervision of normal pregnancy, unspecified, unspecified trimester: Principal | ICD-10-CM | POA: Diagnosis present

## 2023-10-31 LAB — CBC
HCT: 37.2 % (ref 36.0–46.0)
Hemoglobin: 12.2 g/dL (ref 12.0–15.0)
MCH: 26.3 pg (ref 26.0–34.0)
MCHC: 32.8 g/dL (ref 30.0–36.0)
MCV: 80.2 fL (ref 80.0–100.0)
Platelets: 243 K/uL (ref 150–400)
RBC: 4.64 MIL/uL (ref 3.87–5.11)
RDW: 14.5 % (ref 11.5–15.5)
WBC: 8.7 K/uL (ref 4.0–10.5)
nRBC: 0 % (ref 0.0–0.2)

## 2023-10-31 LAB — TYPE AND SCREEN
ABO/RH(D): O POS
Antibody Screen: NEGATIVE

## 2023-10-31 LAB — RPR: RPR Ser Ql: NONREACTIVE

## 2023-10-31 MED ORDER — DIPHENHYDRAMINE HCL 50 MG/ML IJ SOLN: 12.5000 mg | INTRAMUSCULAR | Status: DC | PRN

## 2023-10-31 MED ORDER — MISOPROSTOL 25 MCG QUARTER TABLET
25.0000 ug | ORAL_TABLET | Freq: Once | ORAL | Status: AC
Start: 2023-10-31 — End: 2023-10-31
  Administered 2023-10-31: 25 ug via VAGINAL
  Filled 2023-10-31: qty 1

## 2023-10-31 MED ORDER — SOD CITRATE-CITRIC ACID 500-334 MG/5ML PO SOLN: 30.0000 mL | ORAL | Status: DC | PRN

## 2023-10-31 MED ORDER — PHENYLEPHRINE 80 MCG/ML (10ML) SYRINGE FOR IV PUSH (FOR BLOOD PRESSURE SUPPORT)
80.0000 ug | PREFILLED_SYRINGE | INTRAVENOUS | Status: DC | PRN
  Filled 2023-10-31: qty 10

## 2023-10-31 MED ORDER — TERBUTALINE SULFATE 1 MG/ML IJ SOLN: 0.2500 mg | Freq: Once | INTRAMUSCULAR | Status: DC | PRN

## 2023-10-31 MED ORDER — LACTATED RINGERS IV SOLN
500.0000 mL | Freq: Once | INTRAVENOUS | Status: AC
  Administered 2023-10-31: 500 mL via INTRAVENOUS

## 2023-10-31 MED ORDER — LACTATED RINGERS IV SOLN
500.0000 mL | INTRAVENOUS | Status: DC | PRN
  Administered 2023-10-31: 1000 mL via INTRAVENOUS
  Administered 2023-10-31 – 2023-11-01 (×2): 500 mL via INTRAVENOUS

## 2023-10-31 MED ORDER — ACETAMINOPHEN 325 MG PO TABS: 650.0000 mg | ORAL_TABLET | ORAL | Status: DC | PRN

## 2023-10-31 MED ORDER — ONDANSETRON HCL 4 MG/2ML IJ SOLN
4.0000 mg | Freq: Four times a day (QID) | INTRAMUSCULAR | Status: DC | PRN
  Administered 2023-11-01: 4 mg via INTRAVENOUS
  Filled 2023-10-31: qty 2

## 2023-10-31 MED ORDER — MISOPROSTOL 25 MCG QUARTER TABLET
25.0000 ug | ORAL_TABLET | Freq: Once | ORAL | Status: AC
  Administered 2023-10-31: 25 ug via VAGINAL
  Filled 2023-10-31: qty 1

## 2023-10-31 MED ORDER — LIDOCAINE HCL (PF) 1 % IJ SOLN: 30.0000 mL | INTRAMUSCULAR | Status: DC | PRN

## 2023-10-31 MED ORDER — PHENYLEPHRINE 80 MCG/ML (10ML) SYRINGE FOR IV PUSH (FOR BLOOD PRESSURE SUPPORT): 80.0000 ug | PREFILLED_SYRINGE | INTRAVENOUS | Status: DC | PRN

## 2023-10-31 MED ORDER — MISOPROSTOL 50MCG HALF TABLET: 50.0000 ug | ORAL_TABLET | Freq: Once | ORAL | Status: DC

## 2023-10-31 MED ORDER — OXYTOCIN-SODIUM CHLORIDE 30-0.9 UT/500ML-% IV SOLN
2.5000 [IU]/h | INTRAVENOUS | Status: DC
  Administered 2023-11-01: 2.5 [IU]/h via INTRAVENOUS

## 2023-10-31 MED ORDER — EPHEDRINE 5 MG/ML INJ: 10.0000 mg | INTRAVENOUS | Status: DC | PRN

## 2023-10-31 MED ORDER — MISOPROSTOL 25 MCG QUARTER TABLET
25.0000 ug | ORAL_TABLET | Freq: Once | ORAL | Status: AC
  Administered 2023-10-31: 25 ug via ORAL
  Filled 2023-10-31: qty 1

## 2023-10-31 MED ORDER — OXYTOCIN BOLUS FROM INFUSION
333.0000 mL | Freq: Once | INTRAVENOUS | Status: AC
  Administered 2023-11-01: 333 mL via INTRAVENOUS

## 2023-10-31 MED ORDER — SODIUM CHLORIDE 0.9% FLUSH: 3.0000 mL | Freq: Two times a day (BID) | INTRAVENOUS | Status: DC

## 2023-10-31 MED ORDER — LIDOCAINE HCL (PF) 1 % IJ SOLN
INTRAMUSCULAR | Status: DC | PRN
  Administered 2023-10-31 (×2): 5 mL via EPIDURAL

## 2023-10-31 MED ORDER — OXYTOCIN-SODIUM CHLORIDE 30-0.9 UT/500ML-% IV SOLN
1.0000 m[IU]/min | INTRAVENOUS | Status: DC
  Administered 2023-10-31: 2 m[IU]/min via INTRAVENOUS
  Filled 2023-10-31: qty 500

## 2023-10-31 MED ORDER — SODIUM CHLORIDE 0.9% FLUSH: 3.0000 mL | INTRAVENOUS | Status: DC | PRN

## 2023-10-31 MED ORDER — SODIUM CHLORIDE 0.9 % IV SOLN: 250.0000 mL | INTRAVENOUS | Status: DC | PRN

## 2023-10-31 MED ORDER — HYDROXYZINE HCL 50 MG PO TABS: 50.0000 mg | ORAL_TABLET | Freq: Four times a day (QID) | ORAL | Status: DC | PRN

## 2023-10-31 MED ORDER — LACTATED RINGERS IV SOLN: INTRAVENOUS | Status: DC

## 2023-10-31 MED ORDER — FENTANYL-BUPIVACAINE-NACL 0.5-0.125-0.9 MG/250ML-% EP SOLN
12.0000 mL/h | EPIDURAL | Status: DC | PRN
  Administered 2023-10-31: 12 mL/h via EPIDURAL
  Filled 2023-10-31: qty 250

## 2023-10-31 MED ORDER — FENTANYL CITRATE (PF) 100 MCG/2ML IJ SOLN
50.0000 ug | INTRAMUSCULAR | Status: DC | PRN
  Administered 2023-10-31: 100 ug via INTRAVENOUS
  Filled 2023-10-31: qty 2

## 2023-10-31 NOTE — Anesthesia Preprocedure Evaluation (Signed)
 Anesthesia Evaluation  Patient identified by MRN, date of birth, ID band Patient awake    Reviewed: Allergy & Precautions, H&P , NPO status , Patient's Chart, lab work & pertinent test results  History of Anesthesia Complications Negative for: history of anesthetic complications  Airway Mallampati: II  TM Distance: >3 FB Neck ROM: Full    Dental no notable dental hx.    Pulmonary asthma    Pulmonary exam normal breath sounds clear to auscultation       Cardiovascular negative cardio ROS Normal cardiovascular exam Rhythm:Regular Rate:Normal     Neuro/Psych negative neurological ROS  negative psych ROS   GI/Hepatic negative GI ROS, Neg liver ROS,,,  Endo/Other  negative endocrine ROS    Renal/GU negative Renal ROS  negative genitourinary   Musculoskeletal negative musculoskeletal ROS (+)    Abdominal   Peds negative pediatric ROS (+)  Hematology negative hematology ROS (+)   Anesthesia Other Findings Polyhydramnios IUGR  Plt 243  Reproductive/Obstetrics (+) Pregnancy                              Anesthesia Physical Anesthesia Plan  ASA: 2  Anesthesia Plan: Epidural   Post-op Pain Management:    Induction:   PONV Risk Score and Plan: 2 and Treatment may vary due to age or medical condition  Airway Management Planned: Natural Airway  Additional Equipment:   Intra-op Plan:   Post-operative Plan:   Informed Consent: I have reviewed the patients History and Physical, chart, labs and discussed the procedure including the risks, benefits and alternatives for the proposed anesthesia with the patient or authorized representative who has indicated his/her understanding and acceptance.       Plan Discussed with: Anesthesiologist  Anesthesia Plan Comments: (Patient identified. Risks, benefits, options discussed with patient including but not limited to bleeding, infection,  nerve damage, paralysis, failed block, incomplete pain control, headache, blood pressure changes, nausea, vomiting, reactions to medication, itching, and post partum back pain. Confirmed with bedside nurse the patient's most recent platelet count. Confirmed with the patient that they are not taking any anticoagulation, have any bleeding history or any family history of bleeding disorders. Patient expressed understanding and wishes to proceed. All questions were answered. )        Anesthesia Quick Evaluation

## 2023-10-31 NOTE — Progress Notes (Signed)
 33 yo g2p1001 @ 39+1 here for iol for mild poly, also unstable lie after successful ecv on admission. Received cytotec  and now with moderately-painful contractions. On exam cervix is very well effaced, appears to be about 2 cm but I'm not 100% sure there isn't a very thin amount of cervix overlying everything as it is very effaced and mobile. Foley not successful, cytotec  re-dosed, will plan on re-check in about 4 hours. Cat 1 tracing.

## 2023-10-31 NOTE — H&P (Signed)
 OBSTETRIC ADMISSION HISTORY AND PHYSICAL  Lindsey Harvey is a 33 y.o. female G2P1001 with IUP at [redacted]w[redacted]d by 10 week US  presenting for IOL secondary to unstable lie, polyhydramnios, and velamentous cord. She reports +FMs, No LOF, no VB, no blurry vision, headaches or peripheral edema, and RUQ pain.  She plans on breast feeding. She is undecided on birth control.  She received her prenatal care at [facility].   Dating: By 10 week US  --->  Estimated Date of Delivery: 11/06/23  Sono: @[redacted]w[redacted]d , CWD, normal anatomy, transverse presentation, variable lie, 2522g, 15% EFW  Prenatal History/Complications: HIGH RISK PREGNANCY  # Velamentous cord # Variable lie # Mild polyhydramnios # FGR (resolved) # Large intramural myoma  NURSING  PROVIDER  Office Location [place] Dating by U/S at 10 wks  [facility] Model Traditional Anatomy U/S Large fibroid, possible early FGR  Initiated care at  10wks                 Language  English               LAB RESULTS   Support Person   Genetics NIPS:  AFP: neg      NT/IT (FT only)        Carrier Screen Horizon:   Rhogam  O/Positive/-- (03/19 1102) A1C/GTT Early HgbA1C: 4.7 Third trimester 2 hr GTT: normal  Flu Vaccine Declined 3/19, declined 10/27/23      TDaP Vaccine  Declined  Blood Type O/Positive/-- (03/19 1102)  RSV Vaccine   Antibody Negative (03/19 1102)  COVID Vaccine   Rubella 8.87 (03/19 1102)  Feeding Plan  Breast RPR Non Reactive (03/19 1102)  Contraception undecided HBsAg Negative (03/19 1102)  Circumcision  Yes HIV Non Reactive (03/19 1102)  Pediatrician   [facility] Ped[facility]act[facility] HCVAb Non Reactive (03/19 1102)  Prenatal Classes        BTL Consent   Pap       Diagnosis  Date Value Ref Range Status  04/13/2023     Final    - Negative for intraepithelial lesion or malignancy (NILM)    BTL Pre-payment   GC/CT Initial:   36wks:    VBAC Consent   GBS   For PCN allergy, check sensitivities   BRx Optimized? [ ]  yes   [ ]  no      DME Rx [ ]  BP cuff [ ]   Weight Scale Waterbirth  [ ]  Class [ ]  Consent [ ]  CNM visit  PHQ9 & GAD7 [  ] new OB [  ] 28 weeks  [  ] 36 weeks Induction  [ ]  Orders Entered [ ] Foley Y/N    Past Medical History: Past Medical History:  Diagnosis Date   Asthma    Fibroid     Past Surgical History: Past Surgical History:  Procedure Laterality Date   NO PAST SURGERIES      Obstetrical History: OB History     Gravida  2   Para  1   Term  1   Preterm  0   AB  0   Living  1      SAB  0   IAB  0   Ectopic  0   Multiple  0   Live Births  1           Social History Social History   Socioeconomic History   Marital status: Married    Spouse name: Not on file   Number of children: Not on file  Years of education: Not on file   Highest education level: Not on file  Occupational History   Not on file  Tobacco Use   Smoking status: Never   Smokeless tobacco: Never  Vaping Use   Vaping status: Never Used  Substance and Sexual Activity   Alcohol use: Never   Drug use: Never   Sexual activity: Yes  Other Topics Concern   Not on file  Social History Narrative   ** Merged History Encounter **       Social Drivers of Health   Financial Resource Strain: Not on file  Food Insecurity: No Food Insecurity (10/31/2023)   Hunger Vital Sign    Worried About Running Out of Food in the Last Year: Never true    Ran Out of Food in the Last Year: Never true  Transportation Needs: No Transportation Needs (10/31/2023)   PRAPARE - Administrator, Civil Service (Medical): No    Lack of Transportation (Non-Medical): No  Physical Activity: Not on file  Stress: Not on file  Social Connections: Not on file    Family History: Family History  Problem Relation Age of Onset   Hypertension Mother     Allergies: No Known Allergies  Medications Prior to Admission  Medication Sig Dispense Refill Last Dose/Taking   ferrous sulfate  325 (65 FE) MG tablet Take 1 tablet (325 mg total) by  mouth every other day. 90 tablet 2 Past Month   Prenatal Vit-Fe Fumarate-FA (PREPLUS) 27-1 MG TABS Take 1 tablet by mouth daily. 90 tablet 3 10/30/2023   albuterol  (VENTOLIN  HFA) 108 (90 Base) MCG/ACT inhaler Inhale 2 puffs into the lungs every 6 (six) hours as needed for wheezing or shortness of breath. 8 g 2 Unknown     Review of Systems   All systems reviewed and negative except as stated in HPI  Height 5' 1 (1.549 m), weight 73.1 kg, last menstrual period 01/30/2023, unknown if currently breastfeeding. General appearance: alert, cooperative, and appears stated age Lungs: clear to auscultation bilaterally Heart: regular rate and rhythm Abdomen: soft, non-tender; bowel sounds normal Extremities: Homans sign is negative, no sign of DVT DTR's intact Presentation: Initial presentation transverse but cephalic after ECV.  Fetal monitoringBaseline: 130 bpm, Variability: Good {> 6 bpm), Accelerations: Reactive, and Decelerations: Absent Uterine activityNone  Dilation: 1 Effacement (%): Thick Station: -3 Exam by:: Dr. Magali   Prenatal labs: ABO, Rh: --/--/PENDING (10/06 9956) Antibody: PENDING (10/06 0043) Rubella: 8.87 (03/19 1102) RPR: Non Reactive (07/23 1104)  HBsAg: Negative (03/19 1102)  HIV: Non Reactive (07/23 1104)  GBS: Negative/-- (09/23 1519)    Lab Results  Component Value Date   GBS Negative 10/18/2023   GTT WNL Genetic screening  WNL Anatomy US  WNL  There is no immunization history for the selected administration types on file for this patient.  Prenatal Transfer Tool  Maternal Diabetes: No Genetic Screening: Normal Maternal Ultrasounds/Referrals: Normal Fetal Ultrasounds or other Referrals:  Referred to Materal Fetal Medicine  Maternal Substance Abuse:  No Significant Maternal Medications:  None Significant Maternal Lab Results: Group B Strep negative Number of Prenatal Visits:greater than 3 verified prenatal visits Maternal Vaccinations: None Other  Comments:  None  Results for orders placed or performed during the hospital encounter of 10/31/23 (from the past 24 hours)  CBC   Collection Time: 10/31/23 12:43 AM  Result Value Ref Range   WBC 8.7 4.0 - 10.5 K/uL   RBC 4.64 3.87 - 5.11 MIL/uL   Hemoglobin  12.2 12.0 - 15.0 g/dL   HCT 62.7 63.9 - 53.9 %   MCV 80.2 80.0 - 100.0 fL   MCH 26.3 26.0 - 34.0 pg   MCHC 32.8 30.0 - 36.0 g/dL   RDW 85.4 88.4 - 84.4 %   Platelets 243 150 - 400 K/uL   nRBC 0.0 0.0 - 0.2 %  Type and screen   Collection Time: 10/31/23 12:43 AM  Result Value Ref Range   ABO/RH(D) PENDING    Antibody Screen PENDING    Sample Expiration      11/03/2023,2359 Performed at [facility], [address] [place], [place] [address]     Patient Active Problem List   Diagnosis Date Noted   Encounter for induction of labor 10/31/2023   Velamentous insertion of umbilical cord, third trimester 08/25/2023   Intrauterine growth restriction (IUGR) affecting care of mother, third trimester, single gestation (resolved) 08/22/2023   Fibroid 06/23/2023   Anemia - resolved with PO iron 04/19/2023   Supervision of high risk pregnancy, antepartum, third trimester 04/08/2023    Assessment/Plan:  Lindsey Harvey is a 33 y.o. G2P1001 at [redacted]w[redacted]d here for IOL secondary to velamentous cord, variable lie, and mild polyhydramnios.   #Labor: On initial bedside US , fetal head to maternal right with oblique lie. ECV successfully performed by Dr. Jayne at bedside with infant in cephalic position. Abdominal binder placed. IOL process described in detail. Will start cervical ripening with dual cytotec .   #Pain: Per patient request. Planning epidural.  #FWB: Cat I tracing #GBS status: negative # Velamentous cord: manage expectantly # Mild polyhydramnios: expectant management # FGR (resolved) # Large intramural myoma: 11cm LUS fibroid present, per MFM ok to attempt vaginal delivery.  #Feeding: Breastmilk  #Reproductive Life planning:  undecided  #Circ:  not applicable // girl  Barkley LITTIE Angles, MD  10/31/2023, 1:32 AM

## 2023-10-31 NOTE — Progress Notes (Signed)
 Dr. Jayne came to bedside @0113  and performed a bedside ultrasound. Presentation confirmed not vertex. Version performed by Dr. Jayne at bedside @0122 . Fetal heart rate stable and presentation vertex per bedside ultrasound performed by Dr. Jayne. Version deemed successful. Abdominal binder applied by RN.

## 2023-10-31 NOTE — Progress Notes (Signed)
 Lindsey Harvey is a 33 y.o. G2P1001 at [redacted]w[redacted]d by 10 week ultrasound admitted for induction of labor due to polyhydramnios, unstable lie, ECV on admission.  Subjective: Pt feeling more painful contractions, improved with IV Fentanyl  dose x 1, considering epidural.   Objective: BP 109/69   Pulse 82   Temp 98.3 F (36.8 C) (Oral)   Resp 16   Ht 5' 1 (1.549 m)   Wt 73.1 kg   LMP 01/30/2023   SpO2 100%   BMI 30.46 kg/m  No intake/output data recorded. No intake/output data recorded.  FHT:  FHR: 135 bpm, variability: moderate with periods of minimal,  accelerations:  Present,  decelerations:  Absent UC:   regular, every 2-3 minutes SVE:   deferred  Labs: Lab Results  Component Value Date   WBC 8.7 10/31/2023   HGB 12.2 10/31/2023   HCT 37.2 10/31/2023   MCV 80.2 10/31/2023   PLT 243 10/31/2023    Assessment / Plan: Induction of labor due to polyhydramnios/unstable lie,  progressing well on pitocin  S/P AROM  Labor: Progressing normally Preeclampsia:  n/a Fetal Wellbeing:  Category I Pain Control:  IV pain meds I/D:  GBS neg Anticipated MOD:  NSVD  Olam Boards, CNM 10/31/2023, 9:18 PM

## 2023-10-31 NOTE — Progress Notes (Signed)
 Labor Progress Note Lindsey Harvey is a 33 y.o. G2P1001 at [redacted]w[redacted]d presented for IOL due to mild polyhydramnios with unstable lie and velamentous cord insertion.   S: Doing well.  O:  BP 115/80   Pulse 98   Temp 98.1 F (36.7 C) (Oral)   Resp 17   Ht 5' 1 (1.549 m)   Wt 73.1 kg   LMP 01/30/2023   BMI 30.46 kg/m  EFM: 130/Min/Mod/Max: Moderate Variability/Accelerations (+),Decelerations (-)  CVE: Dilation: Closed Effacement (%): Thick Station: -3 Presentation: Vertex Exam by:: [name]   A&P: 33 y.o. G2P1001 [redacted]w[redacted]d  #Labor: Progressing well. S/P dual cytotec  at 0145. Cervical exam as above. Contracting quite frequently. Will wait until contractions space out prior to giving second dose. #Pain: Per patient request #FWB: Category I #GBS negative #Unstable Lie: ECV performed prior to start of induction. Abdominal binder in place.   [name], MD 7:37 AM

## 2023-10-31 NOTE — Progress Notes (Addendum)
 33 yo g2p1001 @ 30+1 here for iol for mild poly. Has had two rounds of cytotec . Cat 1 tracing. On exam 1.5 cm and posterior, didn't tolerate manual exam well. Attempted Foley with speculum, foley appeared to pass easily but after about 15 cc of water bleeding noted coming from the return. Balloon deflated and removed, decision made to instead move forward with AROM (vertex well-applied) which was performed after verbal informed consent including discussion of risk of cord prolapse, arom performed with light mec. Tracing remains cat 1. Bleeding could be cervical change, trauma from balloon, or abruption; at this time bleeding is not very heavy and fetal status is reassuring so we will proceed. Starting pitocin .

## 2023-10-31 NOTE — Anesthesia Procedure Notes (Addendum)
 Epidural Patient location during procedure: OB Start time: 10/31/2023 8:00 PM End time: 10/31/2023 8:08 PM  Staffing Anesthesiologist: Erma Thom SAUNDERS, MD Performed: anesthesiologist   Preanesthetic Checklist Completed: patient identified, IV checked, risks and benefits discussed, monitors and equipment checked, pre-op evaluation and timeout performed  Epidural Patient position: sitting Prep: DuraPrep Patient monitoring: heart rate, cardiac monitor, continuous pulse ox and blood pressure Approach: midline Location: L3-L4 Injection technique: LOR air  Needle:  Needle type: Tuohy  Needle gauge: 17 G Needle length: 9 cm Needle insertion depth: 5 cm Catheter type: closed end flexible Catheter size: 19 Gauge Catheter at skin depth: 10 cm Test dose: negative  Assessment Sensory level: T8  Additional Notes Patient identified. Risks/Benefits/Options discussed with patient including but not limited to bleeding, infection, nerve damage, paralysis, failed block, incomplete pain control, headache, blood pressure changes, nausea, vomiting, reactions to medication both or allergic, itching and postpartum back pain. Confirmed with bedside nurse the patient's most recent platelet count. Confirmed with patient that they are not currently taking any anticoagulation, have any bleeding history or any family history of bleeding disorders. Patient expressed understanding and wished to proceed. All questions were answered. Sterile technique was used throughout the entire procedure. Please see nursing notes for vital signs. Test dose was given through epidural catheter and negative prior to continuing to dose epidural or start infusion. Warning signs of high block given to the patient including shortness of breath, tingling/numbness in hands, complete motor block, or any concerning symptoms with instructions to call for help. Patient was given instructions on fall risk and not to get out of bed. All questions  and concerns addressed with instructions to call with any issues or inadequate analgesia.  Reason for block:procedure for pain

## 2023-11-01 ENCOUNTER — Encounter (HOSPITAL_COMMUNITY): Payer: Self-pay | Admitting: Obstetrics and Gynecology

## 2023-11-01 ENCOUNTER — Telehealth: Payer: Self-pay | Admitting: *Deleted

## 2023-11-01 DIAGNOSIS — O43123 Velamentous insertion of umbilical cord, third trimester: Secondary | ICD-10-CM | POA: Diagnosis not present

## 2023-11-01 DIAGNOSIS — Z3A39 39 weeks gestation of pregnancy: Secondary | ICD-10-CM | POA: Diagnosis not present

## 2023-11-01 DIAGNOSIS — O43129 Velamentous insertion of umbilical cord, unspecified trimester: Secondary | ICD-10-CM | POA: Diagnosis not present

## 2023-11-01 DIAGNOSIS — O403XX0 Polyhydramnios, third trimester, not applicable or unspecified: Secondary | ICD-10-CM | POA: Diagnosis not present

## 2023-11-01 DIAGNOSIS — Z3A00 Weeks of gestation of pregnancy not specified: Secondary | ICD-10-CM | POA: Diagnosis not present

## 2023-11-01 MED ORDER — LORATADINE 10 MG PO TABS
10.0000 mg | ORAL_TABLET | Freq: Every day | ORAL | Status: DC
  Administered 2023-11-01 – 2023-11-02 (×2): 10 mg via ORAL
  Filled 2023-11-01 (×2): qty 1

## 2023-11-01 MED ORDER — ACETAMINOPHEN 325 MG PO TABS: 650.0000 mg | ORAL_TABLET | ORAL | Status: DC | PRN

## 2023-11-01 MED ORDER — DIPHENHYDRAMINE HCL 25 MG PO CAPS: 25.0000 mg | ORAL_CAPSULE | Freq: Four times a day (QID) | ORAL | Status: DC | PRN

## 2023-11-01 MED ORDER — ZOLPIDEM TARTRATE 5 MG PO TABS: 5.0000 mg | ORAL_TABLET | Freq: Every evening | ORAL | Status: DC | PRN

## 2023-11-01 MED ORDER — ONDANSETRON HCL 4 MG PO TABS: 4.0000 mg | ORAL_TABLET | ORAL | Status: DC | PRN

## 2023-11-01 MED ORDER — ONDANSETRON HCL 4 MG/2ML IJ SOLN: 4.0000 mg | INTRAMUSCULAR | Status: DC | PRN

## 2023-11-01 MED ORDER — CEFAZOLIN SODIUM-DEXTROSE 2-4 GM/100ML-% IV SOLN
2.0000 g | Freq: Three times a day (TID) | INTRAVENOUS | Status: AC
  Administered 2023-11-01 – 2023-11-02 (×3): 2 g via INTRAVENOUS
  Filled 2023-11-01 (×3): qty 100

## 2023-11-01 MED ORDER — TETANUS-DIPHTH-ACELL PERTUSSIS 5-2-15.5 LF-MCG/0.5 IM SUSP: 0.5000 mL | Freq: Once | INTRAMUSCULAR | Status: DC

## 2023-11-01 MED ORDER — SENNOSIDES-DOCUSATE SODIUM 8.6-50 MG PO TABS
2.0000 | ORAL_TABLET | Freq: Every day | ORAL | Status: DC
  Administered 2023-11-02: 2 via ORAL
  Filled 2023-11-01: qty 2

## 2023-11-01 MED ORDER — COCONUT OIL OIL: 1.0000 | TOPICAL_OIL | Status: DC | PRN

## 2023-11-01 MED ORDER — WITCH HAZEL-GLYCERIN EX PADS: 1.0000 | MEDICATED_PAD | CUTANEOUS | Status: DC | PRN

## 2023-11-01 MED ORDER — TRANEXAMIC ACID-NACL 1000-0.7 MG/100ML-% IV SOLN
INTRAVENOUS | Status: AC
  Administered 2023-11-01: 1000 mg
  Filled 2023-11-01: qty 100

## 2023-11-01 MED ORDER — PRENATAL MULTIVITAMIN CH
1.0000 | ORAL_TABLET | Freq: Every day | ORAL | Status: DC
  Administered 2023-11-01 – 2023-11-02 (×2): 1 via ORAL
  Filled 2023-11-01 (×2): qty 1

## 2023-11-01 MED ORDER — TRANEXAMIC ACID-NACL 1000-0.7 MG/100ML-% IV SOLN: 1000.0000 mg | Freq: Once | INTRAVENOUS | Status: DC

## 2023-11-01 MED ORDER — DIBUCAINE (PERIANAL) 1 % EX OINT: 1.0000 | TOPICAL_OINTMENT | CUTANEOUS | Status: DC | PRN

## 2023-11-01 MED ORDER — SIMETHICONE 80 MG PO CHEW: 80.0000 mg | CHEWABLE_TABLET | ORAL | Status: DC | PRN

## 2023-11-01 MED ORDER — IBUPROFEN 600 MG PO TABS
600.0000 mg | ORAL_TABLET | Freq: Four times a day (QID) | ORAL | Status: DC
  Administered 2023-11-01 – 2023-11-02 (×5): 600 mg via ORAL
  Filled 2023-11-01 (×5): qty 1

## 2023-11-01 MED ORDER — BENZOCAINE-MENTHOL 20-0.5 % EX AERO
1.0000 | INHALATION_SPRAY | CUTANEOUS | Status: DC | PRN
  Administered 2023-11-01: 1 via TOPICAL
  Filled 2023-11-01: qty 56

## 2023-11-01 NOTE — Lactation Note (Addendum)
 This note was copied from a baby's chart. Lactation Consultation Note  Patient Name: Lindsey Harvey Date: 11/01/2023 Age:31 hours Reason for consult: Initial assessment;1st time breastfeeding;Primapara;Term  P2, 39 wks, @ 7 hrs of life. Mom holding swaddled baby- last feed not in computer- LC hoping to catch @ feeding time. Per mom baby ate last a little after 11- LC charted. Per mom- did NOT breast feed with first baby. Encouraged mom breasts respond faster and easier with each pregnancy, milk will come easier this time. Baby so far has feedings of 5-8 minutes. Hand pump provided to mom.  Discussed expectations @ breast- Day 1- sleepy/ feed every 3 hrs/ even 10 minutes is okay, Day 2 more awake/ feeding cues/longer feeds, and cluster feeding overnights brings milk in. Highlighted breast stimulation is tied directly to milk production. Encouraged starting with hand expression to start feed. Coconut oil available if mom desires- just ask any staff. LC services and milk storage shared. Encouraged mom to call for assist anytime desired.  Maternal Data Has patient been taught Hand Expression?: No Does the patient have breastfeeding experience prior to this delivery?: No  Feeding Mother's Current Feeding Choice: Breast Milk  LATCH Score Latch: Repeated attempts needed to sustain latch, nipple held in mouth throughout feeding, stimulation needed to elicit sucking reflex.  Audible Swallowing: A few with stimulation  Type of Nipple: Everted at rest and after stimulation  Comfort (Breast/Nipple): Soft / non-tender  Hold (Positioning): Assistance needed to correctly position infant at breast and maintain latch.  LATCH Score: 7   Lactation Tools Discussed/Used Tools: Pump;Flanges Breast pump type: Manual Pump Education: Setup, frequency, and cleaning;Milk Storage  Interventions Interventions: Breast feeding basics reviewed;Hand pump;Hand express;Education;LC Services brochure;CDC milk  storage guidelines  Discharge Pump: Manual (Hand pump provided to mom today)  Consult Status Consult Status: Follow-up Date: 11/02/23 Follow-up type: In-patient    Vinisha Faxon 11/01/2023, 1:20 PM

## 2023-11-01 NOTE — Telephone Encounter (Signed)
Left patient a message to call and schedule. 

## 2023-11-01 NOTE — Anesthesia Postprocedure Evaluation (Signed)
 Anesthesia Post Note  Patient: Lindsey Harvey  Procedure(s) Performed: AN AD HOC LABOR EPIDURAL     Patient location during evaluation: Mother Baby Anesthesia Type: Epidural Level of consciousness: awake and alert and oriented Pain management: satisfactory to patient Vital Signs Assessment: post-procedure vital signs reviewed and stable Respiratory status: respiratory function stable Cardiovascular status: stable Postop Assessment: no headache, no backache, epidural receding, patient able to bend at knees, no signs of nausea or vomiting, adequate PO intake and able to ambulate Anesthetic complications: no   No notable events documented.  Last Vitals:  Vitals:   11/01/23 1329 11/01/23 1643  BP: 100/67 97/65  Pulse: 97 88  Resp: 20 18  Temp: 36.9 C 36.6 C  SpO2: 97% 100%    Last Pain:  Vitals:   11/01/23 1644  TempSrc:   PainSc: 0-No pain   Pain Goal:                Epidural/Spinal Function Cutaneous sensation: Normal sensation (11/01/23 1644), Patient able to flex knees: Yes (11/01/23 1644), Patient able to lift hips off bed: Yes (11/01/23 1644), Back pain beyond tenderness at insertion site: No (11/01/23 1644), Progressively worsening motor and/or sensory loss: No (11/01/23 1644), Bowel and/or bladder incontinence post epidural: No (11/01/23 1644)  Tulio Facundo

## 2023-11-01 NOTE — Progress Notes (Signed)
 Labor Progress Note Brianah Holm is a 33 y.o. G2P1001 at [redacted]w[redacted]d by 10 week ultrasound admitted for induction of labor due to polyhydramnios, unstable lie, ECV on admission   S:  Feeling relief after epidural was placed. Resting comfortably in bed and amenable to cervical exam and IUPC placement now that epidural is working.   O:  BP 106/69   Pulse 74   Temp 98.3 F (36.8 C) (Oral)   Resp 16   Ht 5' 1 (1.549 m)   Wt 73.1 kg   LMP 01/30/2023   SpO2 96%   BMI 30.46 kg/m   EFM: baseline 125 bpm/ moderate variability/ + accels/ -- decels  IUPC: placed 0048 SVE: Dilation: 5 Effacement (%): 80 Cervical Position: Middle Station: -1 Presentation: Vertex Exam by:: Md Darden Flemister Pitocin : 14 mu/min  A/P: 33 y.o. G2P1001 [redacted]w[redacted]d  1. Labor: expected management 2. FWB: Category 1 3. Pain: epidural  Anticipate SVD.  Terrace Compton, MD 2:07 AM

## 2023-11-01 NOTE — Discharge Summary (Shared)
     Postpartum Discharge Summary  Date of Service updated***     Patient Name: Lindsey Harvey DOB: 10-18-1990 MRN: [id number]  Date of admission: 10/31/2023 Delivery date:11/01/2023 Delivering provider: MILLY PLANAS A Date of discharge: 11/01/2023  Admitting diagnosis: Encounter for induction of labor [Z34.90] Intrauterine pregnancy: [redacted]w[redacted]d     Secondary diagnosis:  Principal Problem:   Encounter for induction of labor Active Problems:   Polyhydramnios   Unstable lie of fetus  Additional problems: none    Discharge diagnosis: Term Pregnancy Delivered                                              Post partum procedures:{Postpartum procedures:23558} Augmentation: AROM, Pitocin , and Cytotec  Complications: None  Hospital course: Induction of Labor With Vaginal Delivery   33 y.o. yo G2P1001 at [redacted]w[redacted]d was admitted to the hospital 10/31/2023 for induction of labor.  Indication for induction: velamentous cord insertion/polyhydramnios/unstable lie.  Patient had an uncomplicated labor course. Membrane Rupture Time/Date: 2:10 PM,10/31/2023  Delivery Method:Vaginal, Spontaneous Operative Delivery:N/A Episiotomy: None Lacerations:  2nd degree;Perineal Details of delivery can be found in separate delivery note.  Patient had a postpartum course complicated by***. Patient is discharged home 11/01/23.  Newborn Data: Birth date:11/01/2023 Birth time:5:20 AM Gender:Female Living status:Living Apgars:6 ,9  Weight:2880 g  Magnesium Sulfate received: No BMZ received: No Rhophylac:No MMR:No T-DaP:declined Flu: No RSV Vaccine received: No Transfusion:No  Immunizations received: There is no immunization history for the selected administration types on file for this patient.  Physical exam  Vitals:   11/01/23 0448 11/01/23 0528 11/01/23 0531 11/01/23 0546  BP:  (!) 109/58 116/71 103/62  Pulse:  95 89 85  Resp:      Temp: 98.2 F (36.8 C)     TempSrc: Oral     SpO2:      Weight:       Height:       General: {Exam; general:21111117} Lochia: {Desc; appropriate/inappropriate:30686::appropriate} Uterine Fundus: {Desc; firm/soft:30687} Incision: {Exam; incision:21111123} DVT Evaluation: {Exam; dvt:2111122} Labs: Lab Results  Component Value Date   WBC 8.7 10/31/2023   HGB 12.2 10/31/2023   HCT 37.2 10/31/2023   MCV 80.2 10/31/2023   PLT 243 10/31/2023       No data to display         Edinburgh Score:     No data to display         No data recorded  After visit meds:  Allergies as of 11/01/2023   No Known Allergies   Med Rec must be completed prior to using this [facility]***        Discharge home in stable condition Infant Feeding: {Baby feeding:23562} Infant Disposition:{CHL IP OB HOME WITH FNUYZM:76418} Discharge instruction: per After Visit Summary and Postpartum booklet. Activity: Advance as tolerated. Pelvic rest for 6 weeks.  Diet: {OB ipzu:78888878} Future Appointments: Future Appointments  Date Time Provider Department Center  11/03/2023  9:30 AM Lindsey Nidia CROME, FNP [facility]   Follow up Visit:  Message sent to [facility] on 11/01/23:  Please schedule this patient for a In person postpartum visit in 6 weeks with the following provider: Any provider. Additional Postpartum F/U:none  Low risk pregnancy complicated by: polyhydramnios Delivery mode:  Vaginal, Spontaneous Anticipated Birth Control:  Unsure   11/01/2023 PLANAS MILLY, CNM

## 2023-11-01 NOTE — Telephone Encounter (Signed)
-----   Message from Olam Boards sent at 11/01/2023  6:29 AM EDT ----- Regarding: postpartum schedule Please schedule this patient for a In person postpartum visit in 6 weeks with the following provider: Any provider. Additional Postpartum F/U: none   Low risk pregnancy complicated by:  polyhydramnios Delivery mode: Vaginal, Spontaneous Anticipated Birth Control: Unsure

## 2023-11-02 ENCOUNTER — Other Ambulatory Visit (HOSPITAL_COMMUNITY): Payer: Self-pay

## 2023-11-02 LAB — SURGICAL PATHOLOGY

## 2023-11-02 MED ORDER — ACETAMINOPHEN 500 MG PO TABS
1000.0000 mg | ORAL_TABLET | Freq: Four times a day (QID) | ORAL | 0 refills | Status: AC | PRN
  Filled 2023-11-02: qty 60, 8d supply, fill #0

## 2023-11-02 MED ORDER — SENNOSIDES-DOCUSATE SODIUM 8.6-50 MG PO TABS
2.0000 | ORAL_TABLET | Freq: Every day | ORAL | 0 refills | Status: AC
  Filled 2023-11-02: qty 60, 30d supply, fill #0

## 2023-11-02 MED ORDER — IBUPROFEN 600 MG PO TABS
600.0000 mg | ORAL_TABLET | Freq: Four times a day (QID) | ORAL | 0 refills | Status: AC
  Filled 2023-11-02: qty 30, 8d supply, fill #0

## 2023-11-02 NOTE — Patient Instructions (Signed)
 Your appointment with Outpatient Lactation is: Date: 11/21/2023 Time:10:00 AM [facility] (First Floor) [address] [place]   Check in under baby's name.  Please bring your baby hungry along with your pump and a bottle of either formula or expressed breast milk. Please also bring your pump flanges and we welcome support people! If you need lactation assistance before your appointment, please call [phone] for lactation voice mail.  Lactation support groups:  [facility], Tuesdays 10:00 am -12:00 pm at [address] on the second floor in the conference room, lactating parents and lap babies welcome.  [email or web address]  [email or web address]      Geraldina Louder, [facility] Center for [facility]

## 2023-11-02 NOTE — Lactation Note (Signed)
 This note was copied from a baby's chart. Lactation Consultation Note  Patient Name: Lindsey Harvey Date: 11/02/2023 Age:31 hours   P2, Per MOB, infant recently breastfeed for 5 minutes and afterwards was given 11 mls of formula. MOB feeding choice is breast and formula feeding. Per MOB, she is latching infant first every feeding and afterwards giving formula. Infant recently was feed at 12:20 am. Infant was being held by [facility]. MOB plans to continue to latch first every feeding and then supplement infant. MOB informed LC she did not breastfeed her 1st child. MOB knows to call for latch assistance if needed. MOB will continue to breastfeed infant by cues, on demand, 8-12 times within 24 hours, skin to skin.    Maternal Data    Feeding    LATCH Score                    Lactation Tools Discussed/Used    Interventions    Discharge    Consult Status      Lindsey Harvey 11/02/2023, 12:54 AM

## 2023-11-03 ENCOUNTER — Encounter: Admitting: Obstetrics and Gynecology

## 2023-11-09 ENCOUNTER — Telehealth (HOSPITAL_COMMUNITY): Payer: Self-pay | Admitting: *Deleted

## 2023-11-09 NOTE — Telephone Encounter (Signed)
 11/09/2023  Name: Lindsey Harvey MRN: [id number] DOB: 06-22-1990  Reason for Call:  Transition of Care Hospital Discharge Call  Contact Status: Patient Contact Status: Message  Language assistant needed:          Follow-Up Questions:    Van Postnatal Depression Scale:  In the Past 7 Days:    PHQ2-9 Depression Scale:     Discharge Follow-up:    Post-discharge interventions: NA  Mliss Sieve, RN 11/09/2023 14:44

## 2023-12-13 ENCOUNTER — Ambulatory Visit (INDEPENDENT_AMBULATORY_CARE_PROVIDER_SITE_OTHER): Admitting: Obstetrics and Gynecology

## 2023-12-13 NOTE — Progress Notes (Signed)
 Post Partum Visit Note  Lindsey Harvey is a 33 y.o. G80P2002 female who presents for a postpartum visit. She is 6 weeks postpartum following a normal spontaneous vaginal delivery.  I have fully reviewed the prenatal and intrapartum course. The delivery was at 39 gestational weeks and 2 days.  Anesthesia: epidural. Postpartum course has been good. Baby is doing well. Baby is feeding by breast. Bleeding staining only. Bowel function is normal. Bladder function is normal. Patient is not sexually active. Contraception method is Nexplanon, would like to discuss,  may want to start with birth control pills. Postpartum depression screening: negative.   The pregnancy intention screening data noted above was reviewed. Potential methods of contraception were discussed. The patient elected to proceed with No data recorded.   Edinburgh Postnatal Depression Scale - 12/13/23 0946       Edinburgh Postnatal Depression Scale:  In the Past 7 Days   I have been able to laugh and see the funny side of things. 0    I have looked forward with enjoyment to things. 0    I have blamed myself unnecessarily when things went wrong. 0    I have been anxious or worried for no good reason. 0    I have felt scared or panicky for no good reason. 0    Things have been getting on top of me. 0    I have been so unhappy that I have had difficulty sleeping. 0    I have felt sad or miserable. 0    I have been so unhappy that I have been crying. 0    The thought of harming myself has occurred to me. 0    Edinburgh Postnatal Depression Scale Total 0         Health Maintenance Due  Topic Date Due   DTaP/Tdap/Td (1 - Tdap) Never done   Pneumococcal Vaccine (1 of 2 - PCV) Never done   Hepatitis B Vaccines 19-59 Average Risk (1 of 3 - 19+ 3-dose series) Never done   HPV VACCINES (1 - 3-dose SCDM series) Never done   Influenza Vaccine  Never done   COVID-19 Vaccine (1 - 2025-26 season) Never done    The following portions of  the patient's history were reviewed and updated as appropriate: allergies, current medications, past family history, past medical history, past social history, past surgical history, and problem list.  Review of Systems Pertinent items are noted in HPI.  Objective:  BP 113/73   Pulse (!) 57   Ht 5' (1.524 m)   Wt 129 lb (58.5 kg)   LMP 01/30/2023   Breastfeeding Yes   BMI 25.19 kg/m    General:  alert and cooperative   Breasts:  not indicated  Lungs: clear to auscultation bilaterally  Heart:  regular rate and rhythm, S1, S2 normal, no murmur, click, rub or gallop  Abdomen: soft, non-tender; bowel sounds normal; no masses,  no organomegaly        Assessment:  Normal postpartum exam.  Considering Nexplanon insertion. Will call to schedule.   Plan:   Essential components of care per ACOG recommendations:  1.  Mood and well being: Patient with negative depression screening today. Reviewed local resources for support.  - Patient tobacco use? No.   - hx of drug use? No.    2. Infant care and feeding:  -Patient currently breastmilk feeding? Yes. Reviewed importance of draining breast regularly to support lactation.  -Social determinants of health (SDOH)  reviewed in EPIC. No concerns. The following needs were identified none.   3. Sexuality, contraception and birth spacing - Patient does not want a pregnancy in the next year.  Desired family size is 2 children.  - Reviewed reproductive life planning. Reviewed contraceptive methods based on pt preferences and effectiveness.  Patient desired Hormonal Implant today.   - Discussed birth spacing of 18 months  4. Sleep and fatigue -Encouraged family/partner/community support of 4 hrs of uninterrupted sleep to help with mood and fatigue  5. Physical Recovery  - Discussed patients delivery and complications. She describes her labor as good. - Patient had a Vaginal, no problems at delivery. Patient had a 2nd degree laceration. Perineal  healing reviewed. Patient expressed understanding - Patient has urinary incontinence? No. - Patient is safe to resume physical and sexual activity  6.  Health Maintenance - HM due items addressed Yes - Last pap smear  Diagnosis  Date Value Ref Range Status  04/13/2023   Final   - Negative for intraepithelial lesion or malignancy (NILM)   Pap smear not done at today's visit.  -Breast Cancer screening indicated? No.   7. Chronic Disease/Pregnancy Condition follow up: None  - PCP follow up none -Call for nexplanon insertion   Delon Emms, NP Center for [employer], [facility]

## 2023-12-19 DIAGNOSIS — Z3483 Encounter for supervision of other normal pregnancy, third trimester: Secondary | ICD-10-CM | POA: Diagnosis not present

## 2023-12-19 DIAGNOSIS — Z3482 Encounter for supervision of other normal pregnancy, second trimester: Secondary | ICD-10-CM | POA: Diagnosis not present

## 2024-01-06 DIAGNOSIS — Z419 Encounter for procedure for purposes other than remedying health state, unspecified: Secondary | ICD-10-CM | POA: Diagnosis not present

## 2024-01-24 ENCOUNTER — Encounter: Payer: Self-pay | Admitting: Obstetrics and Gynecology

## 2024-01-25 ENCOUNTER — Other Ambulatory Visit (HOSPITAL_COMMUNITY)
Admission: RE | Admit: 2024-01-25 | Discharge: 2024-01-25 | Disposition: A | Discharge status: Home / Self Care | Source: Ambulatory Visit | Attending: Obstetrics and Gynecology | Admitting: Obstetrics and Gynecology

## 2024-01-25 VITALS — BP 111/73 | HR 84 | Ht 60.0 in | Wt 133.1 lb

## 2024-01-25 DIAGNOSIS — Z113 Encounter for screening for infections with a predominantly sexual mode of transmission: Secondary | ICD-10-CM | POA: Diagnosis present

## 2024-01-25 DIAGNOSIS — N898 Other specified noninflammatory disorders of vagina: Secondary | ICD-10-CM | POA: Insufficient documentation

## 2024-01-25 NOTE — Progress Notes (Signed)
 SUBJECTIVE:  33 y.o. female complains of dark greenish mucous looking vaginal discharge for 2 day(s). Denies abnormal vaginal bleeding  or fever. No UTI symptoms. Denies history of known exposure to STD.  Did have a little pelvic pain lastnight   No LMP recorded.  OBJECTIVE:  She appears well, afebrile.   ASSESSMENT:  Vaginal Discharge  Mild Vaginal Odor   PLAN:  GC, chlamydia, trichomonas, BVAG, CVAG probe sent to lab. Treatment: To be determined once lab results are received ROV prn if symptoms persist or worsen.   Delon Maiden, CMA

## 2024-01-27 ENCOUNTER — Encounter (HOSPITAL_COMMUNITY): Payer: Self-pay

## 2024-01-27 ENCOUNTER — Other Ambulatory Visit: Payer: Self-pay

## 2024-01-27 ENCOUNTER — Inpatient Hospital Stay (HOSPITAL_COMMUNITY)
Admission: AD | Admit: 2024-01-27 | Discharge: 2024-01-28 | Disposition: A | Discharge status: Home / Self Care | Attending: Family Medicine | Admitting: Family Medicine

## 2024-01-27 DIAGNOSIS — N939 Abnormal uterine and vaginal bleeding, unspecified: Secondary | ICD-10-CM

## 2024-01-27 DIAGNOSIS — N946 Dysmenorrhea, unspecified: Secondary | ICD-10-CM | POA: Insufficient documentation

## 2024-01-27 DIAGNOSIS — R109 Unspecified abdominal pain: Secondary | ICD-10-CM

## 2024-01-27 DIAGNOSIS — R10814 Left lower quadrant abdominal tenderness: Secondary | ICD-10-CM | POA: Insufficient documentation

## 2024-01-27 NOTE — ED Triage Notes (Signed)
 Patient is 12 weeks postpartum and has abdominal pain, back pain, vaginal bleeding/abnormal discharge. Patient went to urgent care and was sent here. Vaginal delivery- patient states she had to be stitched up twice. After her placenta was delivered, she was still bleeding. An ultrasound was performed and showed parts of the placenta remained. Contents were removed and patient was stitched up again. Patient still has vaginal bleeding  Patient had her 6 week follow up.

## 2024-01-27 NOTE — ED Triage Notes (Signed)
 Pt states she is 4 weeks post partum, c/o lower abd pain and lower back pain. States she was sent from [facility] for US .

## 2024-01-27 NOTE — ED Provider Triage Note (Signed)
 Emergency Medicine Provider Triage Evaluation Note  Lindsey Harvey , a 34 y.o. female  was evaluated in triage.  Pt complains of vaginal bleeding and lower abdominal pain.  Reports that she is 12 weeks postpartum.  Soaking 1 pad every other hour she reports.  Denies shortness of breath or fatigue.  Endorses severe lower abdominal pain.  Also think she may be passing clots as well.  Review of Systems  Positive: See above Negative: See above  Physical Exam  BP 116/86 (BP Location: Right Arm)   Pulse (!) 55   Temp 98.7 F (37.1 C)   Resp 17   SpO2 98%  Gen:   Awake, no distress   Resp:  Normal effort  MSK:   Moves extremities without difficulty  Other:    Medical Decision Making  Medically screening exam initiated at 11:20 PM.  Appropriate orders placed.  Lindsey Harvey was informed that the remainder of the evaluation will be completed by another provider, this initial triage assessment does not replace that evaluation, and the importance of remaining in the ED until their evaluation is complete.  Work up started [employer] are stable here.  Transferred to MAU.  Dr. Fredirick agreed to accept her.   Lang Norleen POUR, PA-C 01/27/24 2321

## 2024-01-27 NOTE — Progress Notes (Signed)
 " Subjective Patient ID: Lindsey Harvey is a 34 y.o. female.  Chief Complaint  Patient presents with   Abdominal Pain    32 weeks PP (vaginal birth), 3 days of worsening lower abdominal/pelvic pain with thicky/spongy brown-tan/bloody discharge. Pain feels like intermittent contractions. Patient stopped bleeding PP for 1 day and then these symptoms occurred. OB did testing on 12/31 but tests are not resulted yet, they recommended patient go to UC. No complications with birth but placenta was not fully intact when first removed. Has tried warm compress/ibuprofen  but no relief.    Back Pain    Started at birth, but severe 3-4 days ago. No recent injury or heavy lifting.     The following information was reviewed by members of the visit team:  Tobacco  Allergies  Meds  Problems  Med Hx  Surg Hx  OB Status   Fam Hx  Soc Hx     Pt is a 34 yo here for pelvic pain, vaginal bleeding vaginal discharge; states she is PP 32 weeks saw her OB on 32/31/2025, they did testing then which has not resulted. She called them today d/t worsening pain and was told to go to UC for eval; she reports she did have some retained placenta during birth per pt; she states she has had back pain and pelvic pain since birth however pelvic pain is much worse; she is having contraction like pain; she denies fever. She has not had intercourse since childbirth and has had the same partner x 13 years. She is stable and in NAD  Abdominal Pain  Back Pain Associated symptoms include abdominal pain.    Review of Systems  Gastrointestinal:  Positive for abdominal pain.  Musculoskeletal:  Positive for back pain.  All other systems reviewed and are negative.   Objective Physical Exam Vitals and nursing note reviewed.  Constitutional:      General: She is not in acute distress.    Appearance: She is normal weight.  HENT:     Head: Normocephalic.     Nose: Nose normal.     Mouth/Throat:     Mouth: Mucous membranes are  moist.  Eyes:     Extraocular Movements: Extraocular movements intact.     Pupils: Pupils are equal, round, and reactive to light.  Abdominal:     Tenderness: There is abdominal tenderness in the right lower quadrant and suprapubic area.  Skin:    General: Skin is warm.  Neurological:     General: No focal deficit present.     Mental Status: She is alert and oriented to person, place, and time.  Psychiatric:        Mood and Affect: Mood normal.        Behavior: Behavior normal.     Assessment/Plan Diagnoses and all orders for this visit:  Lower abdominal pain -     POC Urinalysis Auto without Microscopic  Acute bilateral low back pain, unspecified whether sciatica present -     POC Urinalysis Auto without Microscopic  Other orders -     multivitamin-Ca-iron-minerals tab; Take 1 tablet by mouth daily.    DDX: Uterine infection, ovarian cyst, uterine fibroids, ovarian torsion, dysmenorrhea,    MDM: Pt here for post partum uterine pain, bleeding, having some dark semi solid discharge as well - pt had pictures on her phone - given this she needs US ; she will go to ER now for US  and further evaluation; risks/benefits discussed. Pt verbalized understanding, agreeable to treatment  plan, stable at departure.   Urgent Care Disposition:  Follow up with ED  Electronically signed: Rexene Charlies Pouch, NP 01/27/2024  10:15 PM   "

## 2024-01-27 NOTE — ED Notes (Signed)
Patient going to MAU

## 2024-01-28 DIAGNOSIS — N939 Abnormal uterine and vaginal bleeding, unspecified: Secondary | ICD-10-CM

## 2024-01-28 DIAGNOSIS — R109 Unspecified abdominal pain: Secondary | ICD-10-CM

## 2024-01-28 DIAGNOSIS — R10814 Left lower quadrant abdominal tenderness: Secondary | ICD-10-CM | POA: Diagnosis not present

## 2024-01-28 DIAGNOSIS — M545 Low back pain, unspecified: Secondary | ICD-10-CM | POA: Diagnosis present

## 2024-01-28 DIAGNOSIS — N946 Dysmenorrhea, unspecified: Secondary | ICD-10-CM | POA: Diagnosis not present

## 2024-01-28 DIAGNOSIS — R1030 Lower abdominal pain, unspecified: Secondary | ICD-10-CM | POA: Diagnosis present

## 2024-01-28 LAB — CBC WITH DIFFERENTIAL/PLATELET
Abs Immature Granulocytes: 0.03 K/uL (ref 0.00–0.07)
Basophils Absolute: 0.1 K/uL (ref 0.0–0.1)
Basophils Relative: 1 %
Eosinophils Absolute: 0.3 K/uL (ref 0.0–0.5)
Eosinophils Relative: 3 %
HCT: 38.1 % (ref 36.0–46.0)
Hemoglobin: 12.5 g/dL (ref 12.0–15.0)
Immature Granulocytes: 0 %
Lymphocytes Relative: 31 %
Lymphs Abs: 2.9 K/uL (ref 0.7–4.0)
MCH: 25.2 pg — ABNORMAL LOW (ref 26.0–34.0)
MCHC: 32.8 g/dL (ref 30.0–36.0)
MCV: 76.7 fL — ABNORMAL LOW (ref 80.0–100.0)
Monocytes Absolute: 0.6 K/uL (ref 0.1–1.0)
Monocytes Relative: 7 %
Neutro Abs: 5.4 K/uL (ref 1.7–7.7)
Neutrophils Relative %: 58 %
Platelets: 212 K/uL (ref 150–400)
RBC: 4.97 MIL/uL (ref 3.87–5.11)
RDW: 16.9 % — ABNORMAL HIGH (ref 11.5–15.5)
WBC: 9.3 K/uL (ref 4.0–10.5)
nRBC: 0 % (ref 0.0–0.2)

## 2024-01-28 LAB — COMPREHENSIVE METABOLIC PANEL WITH GFR
ALT: 12 U/L (ref 0–44)
AST: 19 U/L (ref 15–41)
Albumin: 4.2 g/dL (ref 3.5–5.0)
Alkaline Phosphatase: 109 U/L (ref 38–126)
Anion gap: 10 (ref 5–15)
BUN: 15 mg/dL (ref 6–20)
CO2: 26 mmol/L (ref 22–32)
Calcium: 9.5 mg/dL (ref 8.9–10.3)
Chloride: 104 mmol/L (ref 98–111)
Creatinine, Ser: 0.57 mg/dL (ref 0.44–1.00)
GFR, Estimated: >60 mL/min
Glucose, Bld: 89 mg/dL (ref 70–99)
Potassium: 4.1 mmol/L (ref 3.5–5.1)
Sodium: 140 mmol/L (ref 135–145)
Total Bilirubin: 0.4 mg/dL (ref 0.0–1.2)
Total Protein: 7.9 g/dL (ref 6.5–8.1)

## 2024-01-28 LAB — ABO/RH: ABO/RH(D): O POS

## 2024-01-28 NOTE — MAU Provider Note (Signed)
 " History     CSN: [id number]  Arrival date and time: 01/27/24 2210   None     Chief Complaint  Patient presents with   Abdominal Pain   Vaginal Bleeding   Lindsey Harvey , a  34 y.o. [id number] at 12 weeks PP presents to MAU as a [facility] transfer for vaginal bleeding and abdominal pain. Patient states that she never stopped bleeding from delivery. She reports that bleeding would get lighter then heavier again. Today she reports saturating a pad in 4 hours and passing clots. She also reports lower abdominal pain that she describes as cramping. Patient reports pain was a 8/10 and then she took 2 ibuprofen  at 9pm and pain improved to 5/10. Denies fever chills, nausea and vomiting.          OB History     Gravida  2   Para  2   Term  2   Preterm  0   AB  0   Living  2      SAB  0   IAB  0   Ectopic  0   Multiple  0   Live Births  2           Past Medical History:  Diagnosis Date   Asthma    Fibroid     Past Surgical History:  Procedure Laterality Date   NO PAST SURGERIES      Family History  Problem Relation Age of Onset   Hypertension Mother     Social History[1]  Allergies: Allergies[2]  Medications Prior to Admission  Medication Sig Dispense Refill Last Dose/Taking   Multiple Vitamin (MULTIVITAMIN) tablet Take 1 tablet by mouth daily.   01/28/2024 Morning   acetaminophen  (TYLENOL ) 500 MG tablet Take 2 tablets (1,000 mg total) by mouth every 6 (six) hours as needed for mild pain (pain score 1-3) or moderate pain (pain score 4-6). (Patient not taking: Reported on 01/25/2024) 60 tablet 0    albuterol  (VENTOLIN  HFA) 108 (90 Base) MCG/ACT inhaler Inhale 2 puffs into the lungs every 6 (six) hours as needed for wheezing or shortness of breath. (Patient not taking: Reported on 01/25/2024) 8 g 2    ibuprofen  (ADVIL ) 600 MG tablet Take 1 tablet (600 mg total) by mouth every 6 (six) hours. (Patient not taking: Reported on 01/25/2024) 30 tablet 0    Prenatal Vit-Fe  Fumarate-FA (PREPLUS) 27-1 MG TABS Take 1 tablet by mouth daily. (Patient not taking: Reported on 01/25/2024) 90 tablet 3    senna-docusate (SENOKOT-S) 8.6-50 MG tablet Take 2 tablets by mouth daily. (Patient not taking: Reported on 01/25/2024) 60 tablet 0     Review of Systems  Constitutional:  Negative for chills, fatigue and fever.  Eyes:  Negative for pain and visual disturbance.  Respiratory:  Negative for apnea, shortness of breath and wheezing.   Cardiovascular:  Negative for chest pain and palpitations.  Gastrointestinal:  Negative for abdominal pain, constipation, diarrhea, nausea and vomiting.  Genitourinary:  Positive for pelvic pain and vaginal bleeding. Negative for difficulty urinating, dysuria, vaginal discharge and vaginal pain.  Musculoskeletal:  Negative for back pain.  Neurological:  Negative for seizures, weakness and headaches.  Psychiatric/Behavioral:  Negative for suicidal ideas.    Physical Exam   Blood pressure 117/75, pulse 69, temperature 98 F (36.7 C), temperature source Oral, resp. rate 12, height 5' 1 (1.549 m), weight 60.9 kg, SpO2 98%, currently breastfeeding.  Physical Exam Vitals and nursing note reviewed.  Constitutional:  General: She is not in acute distress.    Appearance: Normal appearance.  HENT:     Head: Normocephalic.  Pulmonary:     Effort: Pulmonary effort is normal.  Abdominal:     Tenderness: There is abdominal tenderness in the left lower quadrant.     Comments: Mild tenderness on deep palpation.   Musculoskeletal:     Cervical back: Normal range of motion.  Skin:    General: Skin is warm and dry.  Neurological:     Mental Status: She is alert and oriented to person, place, and time.  Psychiatric:        Mood and Affect: Mood normal.    Patient Vitals for the past 24 hrs:  BP Temp Temp src Pulse Resp SpO2 Height Weight  01/28/24 0207 -- -- -- -- 12 -- -- --  01/28/24 0032 117/75 98 F (36.7 C) Oral 69 12 -- 5' 1  (1.549 m) 60.9 kg  01/27/24 2214 116/86 98.7 F (37.1 C) -- (!) 55 17 98 % -- --     MAU Course  Procedures Orders Placed This Encounter  Procedures   CBC with Differential/Platelet   Comprehensive metabolic panel   ABO/Rh   Discharge patient Discharge disposition: 01-Home or Self Care; Discharge patient date: 01/28/2024   Results for orders placed or performed during the hospital encounter of 01/27/24 (from the past 24 hours)  CBC with Differential/Platelet     Status: Abnormal   Collection Time: 01/28/24  1:43 AM  Result Value Ref Range   WBC 9.3 4.0 - 10.5 K/uL   RBC 4.97 3.87 - 5.11 MIL/uL   Hemoglobin 12.5 12.0 - 15.0 g/dL   HCT 61.8 63.9 - 53.9 %   MCV 76.7 (L) 80.0 - 100.0 fL   MCH 25.2 (L) 26.0 - 34.0 pg   MCHC 32.8 30.0 - 36.0 g/dL   RDW 83.0 (H) 88.4 - 84.4 %   Platelets 212 150 - 400 K/uL   nRBC 0.0 0.0 - 0.2 %   Neutrophils Relative % 58 %   Neutro Abs 5.4 1.7 - 7.7 K/uL   Lymphocytes Relative 31 %   Lymphs Abs 2.9 0.7 - 4.0 K/uL   Monocytes Relative 7 %   Monocytes Absolute 0.6 0.1 - 1.0 K/uL   Eosinophils Relative 3 %   Eosinophils Absolute 0.3 0.0 - 0.5 K/uL   Basophils Relative 1 %   Basophils Absolute 0.1 0.0 - 0.1 K/uL   Immature Granulocytes 0 %   Abs Immature Granulocytes 0.03 0.00 - 0.07 K/uL  Comprehensive metabolic panel     Status: None   Collection Time: 01/28/24  1:43 AM  Result Value Ref Range   Sodium 140 135 - 145 mmol/L   Potassium 4.1 3.5 - 5.1 mmol/L   Chloride 104 98 - 111 mmol/L   CO2 26 22 - 32 mmol/L   Glucose, Bld 89 70 - 99 mg/dL   BUN 15 6 - 20 mg/dL   Creatinine, Ser 9.42 0.44 - 1.00 mg/dL   Calcium 9.5 8.9 - 89.6 mg/dL   Total Protein 7.9 6.5 - 8.1 g/dL   Albumin 4.2 3.5 - 5.0 g/dL   AST 19 15 - 41 U/L   ALT 12 0 - 44 U/L   Alkaline Phosphatase 109 38 - 126 U/L   Total Bilirubin 0.4 0.0 - 1.2 mg/dL   GFR, Estimated >39 >39 mL/min   Anion gap 10 5 - 15  ABO/Rh     Status:  None   Collection Time: 01/28/24  1:43 AM   Result Value Ref Range   ABO/RH(D) O POS    No rh immune globuloin      NOT A RH IMMUNE GLOBULIN CANDIDATE, PT RH POSITIVE Performed at [facility], [address] [place], [place] [address]     MDM - Patient was seen in clinic 12/31 and was tested for Wet prep and GC. Do not plan to repeat testing.  - unlikely endometritis with asymptomatic sepsis at 12 weeks postpartum.  - Suspicion for return of menses since delivery.  - Patient also desiring to get back home to [place].  - Plan to assess CBC and if WBC normal plan to discharge and follow up outpatient.  - CBC normal, likely menses.  - plan for discharge.    Assessment and Plan   1. Menses painful   2. Vaginal bleeding   3. Abdominal pain, unspecified abdominal location   4. Postpartum care and examination    - Reviewed with patient that this is likely the return of menses posit delivery.  - Reviewed bleeding expectations for menses with fibroids.  - Reviewed worsening signs and return precautions.  - Patient discharged home in stable condition and may return to MAU as needed.   Claris CHRISTELLA Cedar 01/28/2024, 2:04 AM      [1]  Social History Tobacco Use   Smoking status: Never   Smokeless tobacco: Never  Vaping Use   Vaping status: Never Used  Substance Use Topics   Alcohol use: Never   Drug use: Never  [2] No Known Allergies  "

## 2024-01-28 NOTE — MAU Note (Signed)
 Pt says she delivered vag on  Tuesday 11-01-2023. Went home on Thurs 11-03-2023- all ok. Then on Wed - started having lower abd and back pain . Has had VB since delivered. Yesterday became heavier -light brownish pink, then today red.   Has had nickel size clots.  Pad on in Triage- put on at 8pm- has red mod to heavy red blood.  Cramps - 5/10. Took Ibuprofen  at 9pm- 600mg   8/10.

## 2024-01-31 LAB — CERVICOVAGINAL ANCILLARY ONLY
Bacterial Vaginitis (gardnerella): NEGATIVE
Candida Glabrata: NEGATIVE
Candida Vaginitis: NEGATIVE
Chlamydia: NEGATIVE
Comment: NEGATIVE
Comment: NEGATIVE
Comment: NEGATIVE
Comment: NEGATIVE
Comment: NEGATIVE
Comment: NORMAL
Neisseria Gonorrhea: NEGATIVE
Trichomonas: NEGATIVE

## 2024-02-06 ENCOUNTER — Telehealth: Payer: Self-pay

## 2024-02-06 DIAGNOSIS — N939 Abnormal uterine and vaginal bleeding, unspecified: Secondary | ICD-10-CM

## 2024-02-06 MED ORDER — TRANEXAMIC ACID 650 MG PO TABS: 1300.0000 mg | ORAL_TABLET | Freq: Three times a day (TID) | ORAL | 0 refills | Status: DC

## 2024-02-06 NOTE — Telephone Encounter (Signed)
 RN received message on nurse line regarding heavy bleeding. passing clots bigger than grapes x 2 days, saturating pads every hour. RN reviewed with Dr. Cleatus with instructions with pelvic ultrasound and TXA. RN reviewed with patient. Patient to follow up with provider for appointment. Message sent to front office to schedule.  Silvano LELON Piano, RN

## 2024-02-07 MED ORDER — TRANEXAMIC ACID 650 MG PO TABS: 1300.0000 mg | ORAL_TABLET | Freq: Three times a day (TID) | ORAL | 0 refills | Status: AC

## 2024-02-07 NOTE — Addendum Note (Signed)
 Addended by: ORLINDA SILVANO ORN on: 02/07/2024 02:13 PM   Modules accepted: Orders

## 2024-02-07 NOTE — Telephone Encounter (Signed)
 Per patient, [facility] did not receive electronic order. RN attempted to call [facility], could not get through. Pt requested medication be sent to [facility] [address], sent.  Silvano LELON Piano, RN

## 2024-02-09 ENCOUNTER — Other Ambulatory Visit

## 2024-02-09 DIAGNOSIS — N939 Abnormal uterine and vaginal bleeding, unspecified: Secondary | ICD-10-CM | POA: Diagnosis not present

## 2024-02-12 ENCOUNTER — Ambulatory Visit: Payer: Self-pay | Admitting: Obstetrics and Gynecology

## 2024-02-12 DIAGNOSIS — N939 Abnormal uterine and vaginal bleeding, unspecified: Secondary | ICD-10-CM

## 2024-02-12 DIAGNOSIS — D219 Benign neoplasm of connective and other soft tissue, unspecified: Secondary | ICD-10-CM

## 2024-02-17 ENCOUNTER — Telehealth: Payer: Self-pay | Admitting: *Deleted

## 2024-02-17 NOTE — Telephone Encounter (Signed)
 Patient advised of new appointment time.

## 2024-02-21 NOTE — Progress Notes (Unsigned)
 "  GYNECOLOGY OFFICE VISIT NOTE  History:  Lindsey Harvey is a 34 y.o. [id number] here today for discussion of options for fibroid.   Fibroid is 7.2 x 5.7 x 7.6 cm . I reviewed the images of the US  independently and it does appear completely within the uterine cavity (it also felt this way back when I did an exam on her when she was immediately postpartum and had some retained placenta).  There is no flow within the fibroid. The ovaries are normal in appearance.   She has been having heavy bleeding due to the fibroid. She was started on TXA.   She is breastfeeding. ***  She had a CBC on 01/28/24  and it was 12.5. CMP was normal on that same day.   Child-bearing plans: ***  ***   Past Medical History:  Diagnosis Date   Asthma    Fibroid     Past Surgical History:  Procedure Laterality Date   NO PAST SURGERIES      The following portions of the patient's history were reviewed and updated as appropriate: allergies, current medications, past family history, past medical history, past social history, past surgical history and problem list.   Health Maintenance:   Diagnosis  Date Value Ref Range Status  04/13/2023   Final   - Negative for intraepithelial lesion or malignancy (NILM)   Review of Systems:  Pertinent items noted in HPI and remainder of comprehensive ROS otherwise negative.  Physical Exam:  There were no vitals taken for this visit. CONSTITUTIONAL: Well-developed, well-nourished female in no acute distress.  HEENT:  Normocephalic, atraumatic. External right and left ear normal. No scleral icterus.  NECK: Normal range of motion, supple, no masses noted on observation SKIN: No rash noted. Not diaphoretic. No erythema. No pallor. MUSCULOSKELETAL: Normal range of motion. No edema noted. NEUROLOGIC: Alert and oriented to person, place, and time. Normal muscle tone coordination. No cranial nerve deficit noted. PSYCHIATRIC: Normal mood and affect. Normal behavior. Normal judgment  and thought content.  CARDIOVASCULAR: Normal heart rate noted RESPIRATORY: Effort and breath sounds normal, no problems with respiration noted ABDOMEN: No masses noted. No other overt distention noted.    PELVIC: Deferred  Labs and Imaging No results found for this or any previous visit (from the past week). US  PELVIC COMPLETE WITH TRANSVAGINAL Result Date: 02/12/2024 PELVIC ULTRASOUND: CLINICAL INDICATION: Vaginal bleeding TECHNIQUE: Grayscale and color sonographic imaging through the pelvis using a transabdominal and transvaginal approach. COMPARISON: October 26, 2023 FINDINGS: Uterus measures 13.3 x 7.2 x 8.9 cm with a volume of 442.0 mL. Large heterogeneous lesion within the endometrium likely reflects a submucosal fibroid and measures 7.2 x 5.7 x 7.6 cm. The endometrial stripe is not separately visualized. The right ovary measures 3.7 x 2.5 x 1.7 cm for a volume of 8.4 mL. Multiple simple appearing follicles are noted. The left ovary measures 4.1 x 1.4 x 1.7 cm for a volume of 5.0 mL and is only seen on transabdominal imaging. Normal color Doppler flow to both ovaries. Trace free fluid in the pelvis. There is heterogeneous material within the lower uterine segment, suspected to be blood products. IMPRESSION: 1. Large heterogeneous lesion within the endometrium likely reflects a submucosal fibroid. 2.  Heterogeneous material in the lower uterine segment, likely blood products. 3.  Trace free fluid in the pelvis. Electronically signed by: Venetia Neer MD 02/12/2024 07:34 AM EST RP Workstation: [id number]    Assessment and Plan:  1. Fibroid (Primary) - Discussed options available  in context of child-bearing and breastfeeding.     Diagnoses and all orders for this visit:  Fibroid     No orders of the defined types were placed in this encounter.    Routine preventative health maintenance measures emphasized. Please refer to After Visit Summary for other counseling recommendations.    No follow-ups on file.  Vina Solian, MD, FACOG Obstetrician & Gynecologist, [facility] for [facility], [facility]      "

## 2024-02-22 ENCOUNTER — Ambulatory Visit: Admitting: Obstetrics and Gynecology

## 2024-02-22 ENCOUNTER — Encounter: Payer: Self-pay | Admitting: Obstetrics and Gynecology

## 2024-02-22 VITALS — BP 101/72 | HR 81 | Ht 60.0 in | Wt 131.0 lb

## 2024-02-22 DIAGNOSIS — D219 Benign neoplasm of connective and other soft tissue, unspecified: Secondary | ICD-10-CM

## 2024-02-22 MED ORDER — SLYND 4 MG PO TABS: 1.0000 | ORAL_TABLET | Freq: Every day | ORAL | 3 refills | Status: AC
